# Patient Record
Sex: Female | Born: 1970 | Hispanic: Yes | Marital: Married | Smoking: Never smoker
Health system: Southern US, Community
[De-identification: ages and names within clinical notes are randomized; demographics above are authoritative.]

## PROBLEM LIST (undated history)

## (undated) DIAGNOSIS — N189 Chronic kidney disease, unspecified: Secondary | ICD-10-CM

## (undated) DIAGNOSIS — E119 Type 2 diabetes mellitus without complications: Secondary | ICD-10-CM

## (undated) DIAGNOSIS — J302 Other seasonal allergic rhinitis: Secondary | ICD-10-CM

## (undated) DIAGNOSIS — G4730 Sleep apnea, unspecified: Secondary | ICD-10-CM

## (undated) DIAGNOSIS — I10 Essential (primary) hypertension: Secondary | ICD-10-CM

## (undated) DIAGNOSIS — E8881 Metabolic syndrome: Secondary | ICD-10-CM

## (undated) DIAGNOSIS — D169 Benign neoplasm of bone and articular cartilage, unspecified: Secondary | ICD-10-CM

## (undated) DIAGNOSIS — C649 Malignant neoplasm of unspecified kidney, except renal pelvis: Secondary | ICD-10-CM

## (undated) DIAGNOSIS — N200 Calculus of kidney: Secondary | ICD-10-CM

## (undated) HISTORY — DX: Benign neoplasm of bone and articular cartilage, unspecified: D16.9

## (undated) HISTORY — PX: CESAREAN DELIVERY ONLY: 08600

## (undated) HISTORY — PX: CHOLECYSTECTOMY, LAPAROSCOPIC: SHX000214

## (undated) HISTORY — PX: HYSTERECTOMY: AMBSHX0005

## (undated) HISTORY — PX: REPAIR, HIATAL HERNIA: SHX001557

## (undated) HISTORY — PX: LAP UMBILICAL HERNIA REPAIR: S2076

## (undated) HISTORY — PX: NEPHRECTOMY: SHX001104

## (undated) HISTORY — PX: EXTRACORPOREAL SHOCK WAVE LITHOTRIPSY (ESWL): AMBPRO0219

## (undated) HISTORY — PX: OTHER SURGICAL HISTORY: SHX170

## (undated) HISTORY — PX: TUBAL LIGATION: SHX77

## (undated) HISTORY — DX: Chronic kidney disease, unspecified: N18.9

## (undated) HISTORY — PX: CHOLECYSTECTOMY: SHX55

## (undated) HISTORY — DX: Type 2 diabetes mellitus without complications: E11.9

## (undated) NOTE — Progress Notes (Signed)
Formatting of this note is different from the original.  Assessment/Plan     Jeanette Combs was seen today for well women visit.    Diagnoses and all orders for this visit:    Well woman exam with routine gynecological exam (Primary)    Chronic hypertension    Screening mammogram for breast cancer  -     Mammogram Breast Screening Tomosynthesis Bilateral; Future    Vaginal dryness  -     estradioL (VAGIFEM) 10 mcg tablet vaginal tablet; Insert 1 tablet (10 mcg total) into the vagina 2 (two) times a week.    Acute vaginitis  -     Vaginitis Panel    Body mass index is 28.8 kg/m.  Orders Placed This Encounter   Procedures    Mammogram Breast Screening Tomosynthesis Bilateral     Standing Status:   Future     Standing Expiration Date:   12/01/2023     Order Specific Question:   Is the patient pregnant?     Answer:   No     Order Specific Question:   Reason for Exam:     Answer:   screening     Outpatient Encounter Medications as of 10/01/2022   Medication Sig Dispense Refill    acetaminophen (TYLENOL 8 HOUR) 650 mg 8 hr tablet Take 650 mg by mouth every 8 (eight) hours if needed for mild pain score 1 - 3. Do not crush, chew, or split.      desvenlafaxine succinate (PRISTIQ) 50 mg 24-hr tablet Take 50 mg by mouth 1 (one) time each day in the evening.      ergocalciferol (VITAMIN D-2) 1,250 mcg (50,000 unit) capsule Take 50,000 Units by mouth Weekly.      LORazepam (ATIVAN) 1 mg tablet Take 0.5-1 mg by mouth 1 (one) time each day in the evening. 0.5 mg qam, 1 mg qhs      tirzepatide (Mounjaro) 5 mg/0.5 mL SQ Pen Injector Inject 10 mg under the skin every 7 (seven) days. Thursdays      valsartan (DIOVAN) 40 mg tablet Take 40 mg by mouth daily.      albuterol HFA (PROVENTIL HFA;VENTOLIN HFA) inhaler Inhale 2 puffs every 4 (four) hours if needed for wheezing or shortness of breath. (Patient not taking: Reported on 07/08/2022) 1 each 1    ascorbic acid/collagen hydr (COLLAGEN SKIN RENEWAL ORAL) Take 1 Dose by mouth 1 (one) time each day  in the morning. (Patient not taking: Reported on 05/19/2022)      [START ON 10/03/2022] estradioL (VAGIFEM) 10 mcg tablet vaginal tablet Insert 1 tablet (10 mcg total) into the vagina 2 (two) times a week. 24 tablet 3    HYDROmorphone (DILAUDID) 2 mg tablet Take 1 tablet (2 mg total) by mouth every 4 (four) hours if needed for severe pain score 7 - 10. (Patient not taking: Reported on 03/22/2022) 12 tablet 0    valsartan 40 mg tablet 160 mg, hydroCHLOROthiazide 25 mg tablet 12.5 mg Take by mouth 1 (one) time each day in the morning.      VALSARTAN-HYDROCHLOROTHIAZIDE ORAL Take by mouth daily.       No facility-administered encounter medications on file as of 10/01/2022.     RTC in 1 year for WWE or PRN.  Co-testing due 02/2026.  Provided [facility] Menopause h/o and reviewed UpToDate alternatives to HT. RX for Vagifem sent to pharmacy.  Mammogram ordered.  Will notify if vaginitis screen is positive.     Subjective  HPI  Jeanette Combs is a 20 y.o. female [phone] who presents for her well woman exam. Denies unusual vaginal itching, burning or discharge.  Denies dysuria or pain with intercourse.   With current partner x 2 years, feels Safe.  She is starting to experience hot flashes, she is not sleeping well, hip/joint/shoulder pain.     Tx for Trich in May.     02/2022 laparoscopic supracervical hysterectomy, bilateral salpingectomy, sacrocolpopexy with mesh.     History of abnormal Pap smear: yes - years ago, no treatment.  Normal since    Last co-testing 02/2021 negative  Last Mammogram 2022  Last Colonoscopy 03/2021    Family history of breast, endometrial, ovarian cancer yes - MGM breast CA  Family history of colon cancer or pancreatic cancer No  Family history of diabetes Yes parents, patient is prediabetic    No LMP recorded. (Menstrual status: Tubal Ligation).    Family History   Problem Relation Name Age of Onset    Anesthesia Reaction Mother      Diabetes Mother      Hypertension Mother      Heart disease  Father      Hypertension Father      No Known Problems Sister      Heart attack Brother      Cancer Maternal Grandmother          breast    Diabetes Maternal Grandmother      Hypertension Maternal Grandmother      Diabetes Maternal Grandfather      Hypertension Maternal Grandfather      Diabetes Paternal Grandmother      Hypertension Paternal Grandmother      Diabetes Paternal Grandfather      Hypertension Paternal Grandfather       Social History:   reports that she has never smoked. She has been exposed to tobacco smoke. She has never used smokeless tobacco. She reports current alcohol use. She reports that she does not use drugs.    Current Outpatient Medications:     acetaminophen (TYLENOL 8 HOUR) 650 mg 8 hr tablet, Take 650 mg by mouth every 8 (eight) hours if needed for mild pain score 1 - 3. Do not crush, chew, or split., Disp: , Rfl:     desvenlafaxine succinate (PRISTIQ) 50 mg 24-hr tablet, Take 50 mg by mouth 1 (one) time each day in the evening., Disp: , Rfl:     ergocalciferol (VITAMIN D-2) 1,250 mcg (50,000 unit) capsule, Take 50,000 Units by mouth Weekly., Disp: , Rfl:     LORazepam (ATIVAN) 1 mg tablet, Take 0.5-1 mg by mouth 1 (one) time each day in the evening. 0.5 mg qam, 1 mg qhs, Disp: , Rfl:     tirzepatide (Mounjaro) 5 mg/0.5 mL SQ Pen Injector, Inject 10 mg under the skin every 7 (seven) days. Thursdays, Disp: , Rfl:     valsartan (DIOVAN) 40 mg tablet, Take 40 mg by mouth daily., Disp: , Rfl:     albuterol HFA (PROVENTIL HFA;VENTOLIN HFA) inhaler, Inhale 2 puffs every 4 (four) hours if needed for wheezing or shortness of breath. (Patient not taking: Reported on 07/08/2022), Disp: 1 each, Rfl: 1    ascorbic acid/collagen hydr (COLLAGEN SKIN RENEWAL ORAL), Take 1 Dose by mouth 1 (one) time each day in the morning. (Patient not taking: Reported on 05/19/2022), Disp: , Rfl:     [START ON 10/03/2022] estradioL (VAGIFEM) 10 mcg tablet vaginal tablet, Insert 1 tablet (10  mcg total) into the vagina 2  (two) times a week., Disp: 24 tablet, Rfl: 3    HYDROmorphone (DILAUDID) 2 mg tablet, Take 1 tablet (2 mg total) by mouth every 4 (four) hours if needed for severe pain score 7 - 10. (Patient not taking: Reported on 03/22/2022), Disp: 12 tablet, Rfl: 0    valsartan 40 mg tablet 160 mg, hydroCHLOROthiazide 25 mg tablet 12.5 mg, Take by mouth 1 (one) time each day in the morning., Disp: , Rfl:     VALSARTAN-HYDROCHLOROTHIAZIDE ORAL, Take by mouth daily., Disp: , Rfl:     Levofloxacin and Sulfamethoxazole-trimethoprim    Review of Systems    Review of Systems   Constitutional:         Has been experiencing vasomotor symptoms   Gastrointestinal:  Negative for abdominal pain.   Genitourinary:  Negative for dyspareunia, dysuria, genital sores, urgency, vaginal bleeding, vaginal discharge and vaginal itching.   Breast:        Denies breast changes    Objective   Physical Exam:   Constitutional: No distress. She appears well-developed.   Neck: No thyromegaly present.   Cardiovascular:  Normal heart sounds.            Pulmonary/Chest: Effort normal and breath sounds normal.   Breast exam: No axillary or clavicular masses.  Breast symmetrical, no masses, no skin dimpling, no nipple discharge.    Abdominal:      Palpations: Abdomen is soft.      Tenderness: There is no abdominal tenderness. There is no guarding.   Musculoskeletal: Normal range of motion.   Neurological: She is alert and oriented to person, place, and time.   Skin: Skin is warm and dry.   Psychiatric: Judgment and thought content normal.   Genitourinary:    Genitourinary Comments: BUS clear, no unusual vaginal DC. Cervix pink, smooth, no lesions.  Vaginitis screen obtained. No CMT, uterus has been surgically removed. No adnexal tenderness or masses.          Electronically signed by Nicholes Stairs, CNM at 10/01/2022  4:51 PM PDT

## (undated) NOTE — ED Triage Notes (Signed)
Formatting of this note might be different from the original.  Headache since Friday. Reports checking blood pressure yesterday 170/111. Reports has been taking blood pressure medications as prescribed. Nausea but no vomiting. Reports "vision isn't as clear as it is normally."  Electronically signed by Tyrone Schimke at 01/29/2021 10:48 AM PDT

## (undated) NOTE — Progress Notes (Signed)
Formatting of this note might be different from the original.  Review of Systems   Review of Systems   Constitutional: Negative.  Negative for fever and unexpected weight change.   HENT: Negative.     Eyes: Negative.  Negative for pain and visual disturbance.   Respiratory:  Positive for apnea. Negative for cough and shortness of breath.    Cardiovascular: Negative.  Negative for chest pain and palpitations.   Gastrointestinal: Negative.  Negative for nausea and vomiting.   Endocrine: Negative.  Negative for cold intolerance and polyuria.   Genitourinary: Negative.    Musculoskeletal: Negative.  Negative for arthralgias and myalgias.   Skin: Negative.  Negative for rash.   Allergic/Immunologic: Negative.    Neurological:  Positive for headaches. Negative for seizures and numbness.   Hematological: Negative.  Does not bruise/bleed easily.   Psychiatric/Behavioral: Negative.  Negative for dysphoric mood and sleep disturbance.    Breast: Negative.         Electronically signed by Ewing Schlein, MA at 07/08/2022  1:29 PM PDT

## (undated) NOTE — Progress Notes (Signed)
Formatting of this note is different from the original.  Chief Complaint   Patient presents with    Face     HPI:  [name] is a 5 y.o. female with chief complaint of forehead lump.  Location is the right forehead.  Onset was 2014 around the time of her kidney cancer diagnosis.  Pattern has been slowly worsening.  There is tenderness to palpation of the lump.  She does tend to frequently wear hats in the summer and pressure is put directly on this area and it is very uncomfortable.  Struck in the forehead by a softball back in high school.  Otherwise, no history of trauma.    Past Medical History   Diagnosis Date    Abdominal hernia     Cancer (CMS/HCC)     [facility]    Hypertension     PONV (postoperative nausea and vomiting)     Pre-diabetes     Sleep apnea      Past Surgical History   Procedure Laterality Date    Cesarean section      Cholecystectomy      Dilation and curettage of uterus      Hernia repair      Hysterectomy  02/20/2022    Nephrectomy  2014    Wisdom tooth extraction       Current Outpatient Medications:     acetaminophen (TYLENOL 8 HOUR) 650 mg 8 hr tablet, Take 650 mg by mouth every 8 (eight) hours if needed for mild pain score 1 - 3. Do not crush, chew, or split., Disp: , Rfl:     desvenlafaxine succinate (PRISTIQ) 50 mg 24-hr tablet, Take 50 mg by mouth 1 (one) time each day in the evening., Disp: , Rfl:     ergocalciferol (VITAMIN D-2) 1,250 mcg (50,000 unit) capsule, Take 50,000 Units by mouth Weekly., Disp: , Rfl:     LORazepam (ATIVAN) 1 mg tablet, Take 0.5-1 mg by mouth 1 (one) time each day in the evening. 0.5 mg qam, 1 mg qhs, Disp: , Rfl:     tirzepatide (Mounjaro) 5 mg/0.5 mL SQ Pen Injector, Inject 10 mg under the skin every 7 (seven) days. Thursdays, Disp: , Rfl:     valsartan 40 mg tablet 160 mg, hydroCHLOROthiazide 25 mg tablet 12.5 mg, Take by mouth 1 (one) time each day in the morning., Disp: , Rfl:     albuterol HFA (PROVENTIL HFA;VENTOLIN HFA) inhaler, Inhale 2  puffs every 4 (four) hours if needed for wheezing or shortness of breath. (Patient not taking: Reported on 07/08/2022), Disp: 1 each, Rfl: 1    ascorbic acid/collagen hydr (COLLAGEN SKIN RENEWAL ORAL), Take 1 Dose by mouth 1 (one) time each day in the morning. (Patient not taking: Reported on 05/19/2022), Disp: , Rfl:     HYDROmorphone (DILAUDID) 2 mg tablet, Take 1 tablet (2 mg total) by mouth every 4 (four) hours if needed for severe pain score 7 - 10. (Patient not taking: Reported on 03/22/2022), Disp: 12 tablet, Rfl: 0  Allergies         Reactions    Levofloxacin          Social History     Tobacco Use    Smoking status: Never     Passive exposure: Past    Smokeless tobacco: Never   Substance Use Topics    Alcohol use: Yes     Comment: rare     Family History   Problem Relation  Age of Onset    Anesthesia Reaction Mother     Diabetes Mother     Hypertension Mother     Heart disease Father     Hypertension Father     No Known Problems Sister     Heart attack Brother     Cancer Maternal Grandmother         breast    Diabetes Maternal Grandmother     Hypertension Maternal Grandmother     Diabetes Maternal Grandfather     Hypertension Maternal Grandfather     Diabetes Paternal Grandmother     Hypertension Paternal Grandmother     Diabetes Paternal Grandfather     Hypertension Paternal Grandfather      Review of Systems   Constitutional: Negative.  Negative for fever and unexpected weight change.   HENT: Negative.     Eyes: Negative.  Negative for pain and visual disturbance.   Respiratory:  Positive for apnea. Negative for cough and shortness of breath.    Cardiovascular: Negative.  Negative for chest pain and palpitations.   Gastrointestinal: Negative.  Negative for nausea and vomiting.   Endocrine: Negative.  Negative for cold intolerance and polyuria.   Genitourinary: Negative.    Musculoskeletal: Negative.  Negative for arthralgias and myalgias.   Skin: Negative.  Negative for rash.   Allergic/Immunologic: Negative.     Neurological:  Positive for headaches. Negative for seizures and numbness.   Hematological: Negative.  Does not bruise/bleed easily.   Psychiatric/Behavioral: Negative.  Negative for dysphoric mood and sleep disturbance.    Breast: Negative.     Vitals:    07/08/22 1253   Temp: 36.6 C (97.8 F)   TempSrc: Temporal   Weight: 86.2 kg (190 lb 0.6 oz)   Height: 1.727 m (5\' 8" )     Physical examination    General Appearance:   No apparent distress.  Voice:     Normal.  Mental Status:   Alert, oriented.     Face:     Firm lesion of the right anterior forehead affixed to the underlying skull.  Overlying soft tissue rolls over it easily.  Tenderness to palpation.      Sensation intact to V1-3 bilaterally.       Strength and tone - normal.    Neck:     No palpable masses.  No LAD.      Eye:     Extra occular movement intact bilaterally.        Mild ptosis right upper eyelid (longstanding per patient).    Ears:   Pinna:     Bilateral - no growths or deformities.  External auditory canals:  Right -cerumen impaction.      Left - cerumen impaction.  Tympanic Membrane:  Right -unable to visualize      Left - unable to visualize  Hearing:    Gross speech reception intact.    Sinonasal:  External inspection:    No growths or deformities of external nose.   Nasal Mucosa:   No congestion.    Nasal Septum:   Deviates to the right.  Inferior turbinates:   No hypertrophy.      Mouth and Throat:  Lips:     Upper and lower lips without lesions.    Oral Cavity   Mucosa - well hydrated, free of lesions.      Tongue - normal tone and movement.      Gingiva - no bleeding.   Oropharynx:   No  swelling or lesions.        Soft Palate - normal motion.  Symmetric.      Neuropsychiatric:   Mood and affect - normal.      Assessment  and Plan   Problem List         Musculoskeletal    Osteoma of skull - Primary    Relevant Orders    Ambulatory referral to ENT   01/29/2022: CT head.  Right frontal skull osteoma.  Sinuses clear.    CT head was  personally reviewed.  Referral to Dr. Rober Minion, ENT at [facility].  Endoscopic approach to removal of frontal skull osteoma.    Electronically signed by Solon Palm, MD at 07/08/2022  1:29 PM PDT

## (undated) NOTE — Unmapped External Note (Signed)
Formatting of this note is different from the original.  BEST POSSIBLE MEDICATION HISTORY ([facility])  Jeanette Combs  30-Sep-1970 35 y.o. female  [phone]    Below is the Best Possible Medication History compiled for Jeanette Combs by a pharmacy technician, intern pharmacist, or pharmacist with information provided by the patient or the patient's family members, caregivers, providers, retail pharmacy, mail order pharmacy, and/or care facility.    Prior to Admission Medications     Prescriptions Last Dose Informant Patient Reported? Taking?    desvenlafaxine succinate (PRISTIQ) 50 mg 24-hr tablet 01/28/2021 Self Yes Yes    Take 50 mg by mouth daily.    LORazepam (ATIVAN) 1 mg tablet 01/28/2021 Self Yes Yes    Take 0.5-1 mg by mouth 2 times daily. 0.5 mg qam, 1 mg qhs    meloxicam (MOBIC) 15 mg tablet 01/29/2021 Self Yes Yes    Take 15 mg by mouth daily.    metFORMIN (GLUCOPHAGE) 500 mg tablet 01/29/2021 Self Yes Yes    Take 500 mg by mouth 2 (two) times a day with meals.    valsartan (DIOVAN) 160 mg tablet 01/28/2021 Self Yes Yes    Take 160 mg by mouth daily.    naproxen sodium (ALEVE) 220 mg tablet  Self Yes No    Take 220 mg by mouth 2 (two) times a day with meals.       Electronically signed by Lorelee New, Pharmacy Technician at 01/29/2021 11:27 AM PDT

## (undated) NOTE — ED Provider Notes (Signed)
Formatting of this note is different from the original.  We will reduce CHIEF COMPLAINT   Chief Complaint   Patient presents with   ? Hypertension       HPI   Jeanette Combs is a 28 y.o. female who presents with headache for the past 3 days.  States she woke up with this headache.  It is a throbbing moderate to severe constant pain.  Has had intermittent blurry vision but no double vision.  No nausea vomiting.  No fevers or chills.  No neck stiffness.  No focal deficits.  No chest pain or shortness of breath.  Checked her blood pressure at home and it was elevated up to 170 systolic.  History of prior kidney cancer and hypertension.    ALLERGIES   Allergies       Reactions    Levofloxacin          CURRENT MEDICATIONS   Previous Medications    DESVENLAFAXINE SUCCINATE (PRISTIQ) 50 MG 24-HR TABLET    Take 50 mg by mouth daily.    LORAZEPAM (ATIVAN) 1 MG TABLET    Take 0.5-1 mg by mouth 2 times daily. 0.5 mg qam, 1 mg qhs    MELOXICAM (MOBIC) 15 MG TABLET    Take 15 mg by mouth daily.    METFORMIN (GLUCOPHAGE) 500 MG TABLET    Take 500 mg by mouth 2 (two) times a day with meals.    NAPROXEN SODIUM (ALEVE) 220 MG TABLET    Take 220 mg by mouth 2 (two) times a day with meals.    VALSARTAN (DIOVAN) 160 MG TABLET    Take 160 mg by mouth daily.       PAST MEDICAL HISTORY   Past Medical History   Diagnosis Date   ? Abdominal hernia    ? Cancer (CMS/HCC)     [facility]   ? Pre-diabetes      SURGICAL HISTORY   Past Surgical History   Procedure Laterality Date   ? Cesarean section     ? Cholecystectomy     ? Dilation and curettage of uterus     ? Hernia repair     ? Kidney surgery  2014   ? Nephrectomy       SOCIAL HISTORY   Social History     Tobacco Use   ? Smoking status: Never Smoker   ? Smokeless tobacco: Never Used   Vaping Use   ? Vaping Use: never used   Substance Use Topics   ? Alcohol use: Yes     Comment: rare   ? Drug use: No     FAMILY HISTORY   Family History   Problem Relation Age of Onset   ? Diabetes Mother    ?  Hypertension Mother    ? Heart disease Father    ? Hypertension Father    ? Cancer Maternal Grandmother         breast   ? Diabetes Maternal Grandmother    ? Hypertension Maternal Grandmother    ? Diabetes Maternal Grandfather    ? Hypertension Maternal Grandfather    ? Diabetes Paternal Grandmother    ? Hypertension Paternal Grandmother    ? Diabetes Paternal Grandfather    ? Hypertension Paternal Grandfather        Past history has been reviewed by me     REVIEW OF SYSTEMS   A comprehensive review of symptoms is negative except as noted in the History of  Present Illness.     PHYSICAL EXAM   VITAL SIGNS:   Visit Vitals  BP (!) 168/98 (BP Location: Left;Upper arm, Patient Position: Sitting)   Pulse 65   Temp 36.6 C (97.9 F) (Tympanic)   Resp 16   Ht 1.727 m   Wt 92.8 kg   SpO2 98%   BMI 31.11 kg/m   OB Status Tubal Ligation   Smoking Status Never Smoker   BSA 2.06 m     GENERAL: Well-appearing, well-nourished and in no acute distress.  HEENT: Head normocephalic, atraumatic.  Eyes sclera anicteric, conjunctivae normal. Mucous membranes moist.  NECK: Supple, full range of motion, no lymphadenopathy.  PULM: Breath sounds equal bilaterally, no wheezes rales or rhonchi.  CV: Regular rate and rhythm. Well perfused  GI: Soft, nontender.  Nondistended.  No guarding or rebound.  MUSCOLOSKELETAL: Normal range of motion, no clubbing or edema.  NEUROLOGICAL: Awake, alert, and oriented x3. Cranial nerves II through XII grossly intact.  5/5 strength in the bilateral upper and lower extremities with no pronator drift.    SKIN: Warm, dry, no petechiae, no rashes or lesions.  PSYCH: Normal affect    Differential diagnosis and anticipated course:  This is a 74 year old female who presents with headache and hypertension for the last 3 days.  Headache does not seem consistent with subarachnoid hemorrhage or meningitis.  However given her history of prior cancer will obtain a CT scan to rule out mass.  Will obtain labs.  We will  treat with Toradol, Compazine and Benadryl.    Any laboratory, radiology and EKG results reviewed/interpreted by Lauris Poag, MD  LABS   Labs Reviewed   COMPREHENSIVE METABOLIC PANEL - Abnormal       Result Value    Glucose 103      Blood Urea Nitrogen 15      Creat, Serum 0.8      Sodium 137      Potassium 3.8      Chloride 101      CO2 31 (*)     Anion Gap 5 (*)     Calcium 8.9      Total Bilirubin <0.6      Alkaline Phosphatase 68      AST 20      ALT (SGPT) 18      Total Protein 7.0      Albumin 4.0      Globulin, Total 3.0      A/G Ratio 1.33      BUN/Creatinine Ratio 18.75      Corrected Calcium 8.9      eGFR >60.00     CBC WITH AUTO DIFF - Normal    WBC 9.4      RBC 4.8      Hgb 14.3      Hematocrit 41.2      MCV 86.0      MCH 29.9      MCHC 34.7      Red Cell Distribution Width 11.9      Platelets 203      Mean Platelet Volume 9.5      Neutrophils Relative 65.9      Lymphocytes Relative 25.0      Monocytes Relative 6.7      Eosinophils Relative 1.6      Basophils Relative 0.3      NRBC Relative 0.0      Immature Gran Relative 0.5      Neutrophils Absolute 6.2  Lymphocytes Absolute 2.4      Monocytes Absolute 0.6      Eosinophils Absolute 0.2      Basophils Absolute 0.0      NRBC Absolute 0.0      Immature Gran Absolute 0.1     PRO BNP - Normal    BNP N Terminal 81.7         RADIOLOGY   CT Head WO Contrast   Final Result     No acute pathology identified.           Electronically Signed By: Amparo Bristol PETTY MD    On:01/29/2021 1:35 PM     Doc #: [id number]     X-ray chest 1 view portable   Final Result     Cardiomegaly with prominent central vascular congestion and/or acute CHF.     Electronically Signed By: Conni Slipper, MD    On:01/29/2021 11:40 AM     Doc #: [id number]     Single AP VIEW Portable Chest X-ray was interpreted independently and contemporaneously by A. Eula Listen, MD:   No cardiomegaly  Normal aortic shadow  No lung infiltrates  No pneumothorax  No abdominal free air  No other soft tissue or  bony abnormalities  Overall impression: No acute findings    EKG   12-lead EKG Interpretation by A. Eula Listen, MD:   Normal Sinus Rhythm at 82 beats per minute  Normal axis  Normal intervals  No ectopic beats  Inferior Q waves  No other acute ST or T wave changes  Overall impression is abnormal EKG    PROCEDURE   Procedures    The medications that were given during this visit includes:   Medications   ketorolac (TORADOL) injection 30 mg (30 mg intramuscular Given 01/29/21 1215)   prochlorperazine (COMPAZINE) tablet 10 mg (10 mg oral Given 01/29/21 1216)   diphenhydrAMINE (BENADRYL) capsule 25 mg (25 mg oral Given 01/29/21 1215)       ED COURSE  Nursing notes have been reviewed by me  Prior medical records have been reviewed by me and reveals prior OB/GYN and general surgery clinic visits    MEDICAL DECISION MAKING   This is a 35 year old female who presents with headache and hypertension.  EKG, chest x-ray and laboratory work-up with no evidence of acute cardiopulmonary process.  No clinical evidence of CHF.  CT is negative for mass or bleed.  No need for admission or further work-up at this time.  Recommend rest and hydration.  Patient understands and agrees with plan. Patient is well-appearing, nontoxic and stable for discharge with strict return precautions.    PLAN   Discussed dx, medications and treatment plan with patient  Rx for D/C - see below.   The discharge medications included (please note that some discharge medications may be mentioned above, and may not be listed below):     New Prescriptions    No medications on file       FINAL IMPRESSION   1. Acute nonintractable headache, unspecified headache type    2. Primary hypertension        Parts of this note were created using Dragon Voice Recognition software program. While efforts were made to correct any mistakes made by this voice recognition software program, nonsensical phrases may remain in this note.    MDM    Lauris Poag, MD  01/29/21  1343    Electronically signed by Lauris Poag, MD at 01/29/2021  1:43 PM PDT

---

## 2014-07-04 ENCOUNTER — Other Ambulatory Visit (HOSPITAL_COMMUNITY): Payer: Self-pay | Admitting: Urology

## 2014-07-04 DIAGNOSIS — Z1231 Encounter for screening mammogram for malignant neoplasm of breast: Secondary | ICD-10-CM

## 2014-07-07 ENCOUNTER — Ambulatory Visit (HOSPITAL_COMMUNITY)
Admission: RE | Admit: 2014-07-07 | Discharge: 2014-07-07 | Disposition: A | Discharge status: Home / Self Care | Payer: Self-pay | Source: Ambulatory Visit | Attending: Urology | Admitting: Urology

## 2014-07-07 DIAGNOSIS — Z1231 Encounter for screening mammogram for malignant neoplasm of breast: Secondary | ICD-10-CM

## 2015-07-18 ENCOUNTER — Other Ambulatory Visit: Payer: Self-pay | Admitting: Nurse Practitioner

## 2015-07-18 DIAGNOSIS — Z1231 Encounter for screening mammogram for malignant neoplasm of breast: Secondary | ICD-10-CM

## 2015-07-26 ENCOUNTER — Ambulatory Visit
Admission: RE | Admit: 2015-07-26 | Discharge: 2015-07-26 | Disposition: A | Discharge status: Home / Self Care | Payer: No Typology Code available for payment source | Source: Ambulatory Visit | Attending: Nurse Practitioner | Admitting: Nurse Practitioner

## 2015-07-26 DIAGNOSIS — Z1231 Encounter for screening mammogram for malignant neoplasm of breast: Secondary | ICD-10-CM

## 2015-07-27 ENCOUNTER — Other Ambulatory Visit: Payer: Self-pay | Admitting: Nurse Practitioner

## 2015-07-27 DIAGNOSIS — R928 Other abnormal and inconclusive findings on diagnostic imaging of breast: Secondary | ICD-10-CM

## 2015-08-08 ENCOUNTER — Other Ambulatory Visit (HOSPITAL_COMMUNITY): Payer: Self-pay | Admitting: *Deleted

## 2015-08-08 DIAGNOSIS — R928 Other abnormal and inconclusive findings on diagnostic imaging of breast: Secondary | ICD-10-CM

## 2015-08-17 ENCOUNTER — Encounter (HOSPITAL_COMMUNITY): Payer: Self-pay

## 2015-08-17 ENCOUNTER — Ambulatory Visit
Admission: RE | Admit: 2015-08-17 | Discharge: 2015-08-17 | Disposition: A | Discharge status: Home / Self Care | Payer: No Typology Code available for payment source | Source: Ambulatory Visit | Attending: Obstetrics and Gynecology | Admitting: Obstetrics and Gynecology

## 2015-08-17 ENCOUNTER — Other Ambulatory Visit (HOSPITAL_COMMUNITY): Payer: Self-pay | Admitting: Obstetrics and Gynecology

## 2015-08-17 ENCOUNTER — Ambulatory Visit (HOSPITAL_COMMUNITY)
Admission: RE | Admit: 2015-08-17 | Discharge: 2015-08-17 | Disposition: A | Discharge status: Home / Self Care | Payer: Self-pay | Source: Ambulatory Visit | Attending: Obstetrics and Gynecology | Admitting: Obstetrics and Gynecology

## 2015-08-17 VITALS — BP 100/62 | Temp 98.4°F | Ht 60.0 in | Wt 182.4 lb

## 2015-08-17 DIAGNOSIS — R928 Other abnormal and inconclusive findings on diagnostic imaging of breast: Secondary | ICD-10-CM

## 2015-08-17 DIAGNOSIS — Z1239 Encounter for other screening for malignant neoplasm of breast: Secondary | ICD-10-CM

## 2015-08-17 HISTORY — DX: Other seasonal allergic rhinitis: J30.2

## 2015-08-17 NOTE — Patient Instructions (Signed)
Educational materials on self breast awareness given. Explained to Tracey ChapelMaria Estela Ortega that she did not need a Pap smear today due to last Pap smear was in 2016 per patient. Let her know [facility] will cover Pap smears every 3 years unless has a history of abnormal Pap smears. [facility] breast diagnostic mamm[facility] per recommendation. Appointment scheduled for Thursday, Aug 17, 2015 at 1520.  Tracey Ortega verbalized understanding.  Therisa Mennella, Kathaleen Maserhristine Poll, RN 4:10 PM

## 2015-08-17 NOTE — Progress Notes (Signed)
[facility] due to needing additional imaging of the left breast. Screening mammogram completed 07/26/2015.  Pap Smear: Pap smear not completed today. [facility] and normal per patient. Per patient has no history of an abnormal Pap smear. No Pap smear results in EPIC.  Physical exam: Breasts Breasts symmetrical. No skin abnormalities bilateral breasts. No nipple retraction bilateral breasts. No nipple discharge bilateral breasts. No lymphadenopathy. No lumps palpated bilateral breasts. No complaints of pain or tenderness on exam.   [facility] a left breast diagnostic mammogram per recommendation. Appointment scheduled for Thursday, Aug 17, 2015 at 1520.  Pelvic/Bimanual No Pap smear completed today since last Pap smear was in 2016 per patient. Pap smear not indicated per [facility]CP guidelines.   Smoking History: Patient has never smoked.  Patient Navigation: Patient education provided. [facility] services provided [facility] through [facility].

## 2015-08-18 ENCOUNTER — Encounter (HOSPITAL_COMMUNITY): Payer: Self-pay | Admitting: *Deleted

## 2015-11-20 ENCOUNTER — Encounter (HOSPITAL_COMMUNITY): Payer: Self-pay | Admitting: *Deleted

## 2016-06-19 VITALS — BP 128/78 | HR 81 | Temp 97.5°F | Ht 62.0 in | Wt 177.2 lb

## 2016-06-19 DIAGNOSIS — R109 Unspecified abdominal pain: Secondary | ICD-10-CM

## 2016-06-19 DIAGNOSIS — M5441 Lumbago with sciatica, right side: Secondary | ICD-10-CM

## 2016-06-19 DIAGNOSIS — M5136 Other intervertebral disc degeneration, lumbar region: Secondary | ICD-10-CM | POA: Insufficient documentation

## 2016-06-19 DIAGNOSIS — E119 Type 2 diabetes mellitus without complications: Secondary | ICD-10-CM | POA: Insufficient documentation

## 2016-06-19 LAB — POCT URINALYSIS DIP (MANUAL ENTRY)
BILIRUBIN UA: NEGATIVE
BILIRUBIN UA: NEGATIVE
GLUCOSE UA: NEGATIVE
PH UA: 5.5
RBC UA: NEGATIVE
SPEC GRAV UA: 1.015

## 2016-08-06 ENCOUNTER — Other Ambulatory Visit: Payer: Self-pay | Admitting: Nurse Practitioner

## 2016-08-06 DIAGNOSIS — Z1231 Encounter for screening mammogram for malignant neoplasm of breast: Secondary | ICD-10-CM

## 2016-08-19 ENCOUNTER — Ambulatory Visit
Admission: RE | Admit: 2016-08-19 | Discharge: 2016-08-19 | Disposition: A | Discharge status: Home / Self Care | Payer: No Typology Code available for payment source | Source: Ambulatory Visit | Attending: Nurse Practitioner | Admitting: Nurse Practitioner

## 2016-08-19 DIAGNOSIS — Z1231 Encounter for screening mammogram for malignant neoplasm of breast: Secondary | ICD-10-CM

## 2017-07-04 ENCOUNTER — Other Ambulatory Visit: Payer: Self-pay | Admitting: Obstetrics & Gynecology

## 2017-07-04 DIAGNOSIS — Z1231 Encounter for screening mammogram for malignant neoplasm of breast: Secondary | ICD-10-CM

## 2017-08-26 ENCOUNTER — Ambulatory Visit
Admission: RE | Admit: 2017-08-26 | Discharge: 2017-08-26 | Disposition: A | Discharge status: Home / Self Care | Payer: No Typology Code available for payment source | Source: Ambulatory Visit | Attending: Obstetrics & Gynecology | Admitting: Obstetrics & Gynecology

## 2017-08-26 DIAGNOSIS — Z1231 Encounter for screening mammogram for malignant neoplasm of breast: Secondary | ICD-10-CM

## 2018-07-31 ENCOUNTER — Other Ambulatory Visit (HOSPITAL_COMMUNITY): Payer: Self-pay | Admitting: *Deleted

## 2018-07-31 DIAGNOSIS — Z1231 Encounter for screening mammogram for malignant neoplasm of breast: Secondary | ICD-10-CM

## 2018-11-24 ENCOUNTER — Ambulatory Visit
Admission: RE | Admit: 2018-11-24 | Discharge: 2018-11-24 | Disposition: A | Discharge status: Home / Self Care | Payer: No Typology Code available for payment source | Source: Ambulatory Visit | Attending: Obstetrics and Gynecology | Admitting: Obstetrics and Gynecology

## 2018-11-24 ENCOUNTER — Encounter (HOSPITAL_COMMUNITY): Payer: Self-pay

## 2018-11-24 ENCOUNTER — Other Ambulatory Visit: Payer: Self-pay

## 2018-11-24 ENCOUNTER — Ambulatory Visit (HOSPITAL_COMMUNITY)
Admission: RE | Admit: 2018-11-24 | Discharge: 2018-11-24 | Disposition: A | Discharge status: Home / Self Care | Payer: No Typology Code available for payment source | Source: Ambulatory Visit | Attending: Obstetrics and Gynecology | Admitting: Obstetrics and Gynecology

## 2018-11-24 DIAGNOSIS — Z1239 Encounter for other screening for malignant neoplasm of breast: Secondary | ICD-10-CM | POA: Insufficient documentation

## 2018-11-24 DIAGNOSIS — Z1231 Encounter for screening mammogram for malignant neoplasm of breast: Secondary | ICD-10-CM

## 2018-11-24 NOTE — Patient Instructions (Signed)
Explained breast self awareness with Clyda Greener. Patient did not need a Pap smear today due to last Pap smear was in March/April 2019 per patient. Let her know [facility] will cover Pap smears every 3 years unless has a history of abnormal Pap smears. Referred patient to the [place] for a screening mammogram. Appointment scheduled for Tuesday, November 24, 2018 at 1130. Patient aware of appointment and will be there. Let patient know the Breast Center will follow up with her within the next couple weeks with results of mammogram by letter or phone. Clyda Greener verbalized understanding.  Sheina Mcleish, Arvil Chaco, RN 10:46 AM

## 2018-11-24 NOTE — Progress Notes (Signed)
No complaints today.   Pap Smear: Pap smear not completed today. Last Pap smear was in March/April 2019 at the [facility] and normal per patient. Per patient has no history of an abnormal Pap smear. Last Pap smear result is not in Epic. Previous Pap smear result from 06/22/2014 is in [place].  Physical exam: Breasts Breasts symmetrical. No skin abnormalities bilateral breasts. No nipple retraction bilateral breasts. No nipple discharge bilateral breasts. No lymphadenopathy. No lumps palpated bilateral breasts. No complaints of pain or tenderness on exam. Referred patient to the [place] for a screening mammogram. Appointment scheduled for Tuesday, November 24, 2018 at 1130.        Pelvic/Bimanual No Pap smear completed today since last Pap smear was in March/April 2019. Pap smear not indicated per B[facility]CP guidelines.   Smoking History: Patient has never smoked.  Patient Navigation: Patient education provided. Access to services provided for patient through [facility] program.   Breast and Cervical Cancer Risk Assessment: Patient has no family history of breast cancer, known genetic mutations, or radiation treatment to the chest before age 65. Patient has no history of cervical dysplasia, immunocompromised, or DES exposure in-utero.  Risk Assessment    Risk Scores      11/24/2018   Last edited by: Armond Hang, LPN   5-year risk: 0.8 %   Lifetime risk: 7.9 %

## 2018-11-26 ENCOUNTER — Encounter (HOSPITAL_COMMUNITY): Payer: Self-pay | Admitting: *Deleted

## 2018-11-30 ENCOUNTER — Other Ambulatory Visit: Payer: Self-pay

## 2018-11-30 ENCOUNTER — Inpatient Hospital Stay: Payer: Self-pay | Attending: Obstetrics and Gynecology | Admitting: *Deleted

## 2018-11-30 VITALS — BP 122/84 | Temp 97.3°F | Ht 59.0 in | Wt 166.0 lb

## 2018-11-30 DIAGNOSIS — Z0000 Encounter for general adult medical examination without abnormal findings: Secondary | ICD-10-CM

## 2018-12-01 LAB — HGB A1C W/O EAG: Hgb A1c MFr Bld: 6.3 % — ABNORMAL HIGH (ref 4.8–5.6)

## 2018-12-01 LAB — GLUCOSE, RANDOM: Glucose: 119 mg/dL — ABNORMAL HIGH (ref 65–99)

## 2018-12-01 LAB — LIPID PANEL W/O CHOL/HDL RATIO
Cholesterol, Total: 182 mg/dL (ref 100–199)
HDL: 56 mg/dL (ref >39)
LDL Calculated: 100 mg/dL — ABNORMAL HIGH (ref 0–99)
Triglycerides: 131 mg/dL (ref 0–149)
VLDL Cholesterol Cal: 26 mg/dL (ref 5–40)

## 2018-12-01 NOTE — Progress Notes (Signed)
[facility] initial screening   interpreter- Rudene Anda, [employer]   Clinical Measurement:  Height: 59 in Weight: 166 lb  Blood Pressure: 120/84  Blood Pressure #2: 122/84 Fasting Labs Drawn Today, will review with patient when they result.   Medical History:  Patient states that she does not have a history of high cholesterol, high blood pressure or diabetes.  Medications:  Patient states that she does not take  medication to lower cholesterol, blood pressure or blood sugar.  Patient does not take an aspirin a day to help prevent a heart attack or stroke.    Blood pressure, self measurement: Patient states that she measures blood pressure from home monthly. Patient does not share blood pressure readings with a healthcare provider.   Nutrition: Patient states that on average she eats 1 cups of fruit and 1 cups of vegetables per day. Patient states that she does not eat fish at least 2 times per week. Patient eats less than half servings of whole grains. Patient drinks less than 36 ounces of beverages with added sugar weekly. Patient is not currently watching sodium or salt intake. In the past 7 days patient has had 1 drink containing alcohol. On average patient drinks 1 drink containing alcohol.      Physical activity:  Patient states that she gets 0 minutes of moderate and 0 minutes of vigorous physical activity each week.  Smoking status:  Patient states that she has never smoked tobacco.   Quality of life:  Over the past 2 weeks patient states that she has not had any days where she has little interest or pleasure in doing things and 0 days where she has felt down, depressed or hopeless.    Risk reduction and counseling:    Health Coaching: Educated patient that daily recommendations for fruit is 2 cups per day and 3 cups of vegetables per day. I also told the patient about heart healthy fish like salmon, tuna and mackerel that she can try to eat 2 times a week. We also discussed what whole  grains are and how she can make substitutions to add them into her diet.   Navigation:  I will notify patient of lab results.  Patient is aware of 2 more health coaching sessions and a follow up.  Time: 20 minutes

## 2018-12-02 ENCOUNTER — Telehealth: Payer: Self-pay

## 2018-12-02 NOTE — Telephone Encounter (Signed)
Health coaching 2     Labs- 182 cholesterol, 100 LDL cholesterol, 131 triglycerides, 56 HDL cholesterol, 6.3 hemoglobin A1C, 119 mean plasma glucose  Patient understands and is aware of her lab results.   Goals-  Spoke with patient about lab results. Answered any questions patient had regarding labs. Informed patient that since her hemoglobin A1C and glucose were elevated I would be referring her for follow-up at [facility].  Goals-Eat more fruits and vegetables. With a goal of 2 fruits and 3 vegetables per day. Also to watch sugar/sweets and carbs that she consumes. Explained that too much sugar and carbs can raise blood sugar. Encouraged patient to also start walking for 20 minutes a day.    Navigation:  Patient is aware of 1 more health coaching sessions and a follow up. Follow-up scheduled with [facility] on September 1st @ 3:30 pm.  Time- 15 minutes

## 2018-12-08 ENCOUNTER — Ambulatory Visit (INDEPENDENT_AMBULATORY_CARE_PROVIDER_SITE_OTHER): Payer: Self-pay | Admitting: Internal Medicine

## 2018-12-08 ENCOUNTER — Other Ambulatory Visit: Payer: Self-pay

## 2018-12-08 ENCOUNTER — Encounter: Payer: Self-pay | Admitting: Internal Medicine

## 2018-12-08 DIAGNOSIS — M79671 Pain in right foot: Secondary | ICD-10-CM

## 2018-12-08 DIAGNOSIS — R030 Elevated blood-pressure reading, without diagnosis of hypertension: Secondary | ICD-10-CM

## 2018-12-08 DIAGNOSIS — M79672 Pain in left foot: Secondary | ICD-10-CM

## 2018-12-08 DIAGNOSIS — E669 Obesity, unspecified: Secondary | ICD-10-CM | POA: Insufficient documentation

## 2018-12-08 DIAGNOSIS — R7303 Prediabetes: Secondary | ICD-10-CM

## 2018-12-08 DIAGNOSIS — Z6833 Body mass index (BMI) 33.0-33.9, adult: Secondary | ICD-10-CM

## 2018-12-08 DIAGNOSIS — R350 Frequency of micturition: Secondary | ICD-10-CM

## 2018-12-08 NOTE — Assessment & Plan Note (Signed)
Patient referred with an elevated hemoglobin A1c of 6.3 and a fasting glucose of 119 detected.  Patient with family history of diabetes mellitus.  Patient's labs are consistent with prediabetes.  There is no strong indication at this time for anti-diabetic medical management.  Patient was counseled on weight loss, diet modification, and exercise.  Patient is motivated to make necessary lifestyle modifications.  We will recheck hemoglobin A1c at follow-up appointment in 1 year.

## 2018-12-08 NOTE — Progress Notes (Signed)
   CC: Elevated hemoglobin A1c  HPI:  [name] is a 48 y.o. with no significant past medical history who presents from [facility] program referral for elevated hemoglobin A1c to 6.3.  Please see problem based charting below.  Past Medical History:  Diagnosis Date  . Seasonal allergies    PSH: C-section  Social History: Denies alcohol, recreational drug, or smoking  Family History: Mother and brother have diabetes and hypertension  Allergies: Denies allergies to medications  Review of Systems:   Review of Systems  Constitutional: Negative for chills and fever.  HENT: Negative for congestion.   Respiratory: Negative for cough and shortness of breath.   Cardiovascular: Negative for chest pain.  Gastrointestinal: Negative for abdominal pain, constipation, diarrhea, nausea and vomiting.  Genitourinary: Positive for frequency (mild increase in urination). Negative for dysuria and urgency.  All other systems reviewed and are negative.    Physical Exam:  Vitals:   12/08/18 1550  BP: 131/69  Pulse: 82  Temp: 98.2 F (36.8 C)  TempSrc: Oral  SpO2: 100%  Weight: 167 lb 3.2 oz (75.8 kg)  Height: 4\' 11"  (1.499 m)   Physical Exam  Constitutional: She is well-developed, well-nourished, and in no distress.  HENT:  Head: Normocephalic and atraumatic.  Eyes: EOM are normal. Right eye exhibits no discharge. Left eye exhibits no discharge.  Neck: Normal range of motion.  Cardiovascular: Normal rate and regular rhythm. Exam reveals no gallop and no friction rub.  No murmur heard. Pulmonary/Chest: Effort normal and breath sounds normal. No respiratory distress. She has no wheezes. She has no rales.  Abdominal: Soft. She exhibits no distension. There is no abdominal tenderness. There is no rebound and no guarding.  Musculoskeletal: Normal range of motion.        General: No tenderness, deformity or edema.     Comments: Feet examined, no tenderness to palpation  appreciated  Neurological: She is alert. Coordination normal.  Skin: Skin is warm and dry. No rash noted. She is not diaphoretic. No erythema.  Psychiatric: Memory and judgment normal.     Assessment & Plan:   See Encounters Tab for problem based charting.  Patient seen and discussed with Dr. Heber Rancho Ortega

## 2018-12-08 NOTE — Assessment & Plan Note (Signed)
Patient reports bilateral heel pain that has been present over the past 2 weeks.  Patient reports the pain often occurs when she first places her feet on the floor.  Pain has improved with increase in exercise regimen over the past weekend and patient has recently acquired good fitting shoes.  Advised patient on short-term use of ibuprofen for pain management.  Exam without tenderness to palpation or visible abnormality. Pain may be secondary to bone spurs

## 2018-12-08 NOTE — Patient Instructions (Signed)
Today you were seen for an elevated hemoglobin A1c.  This tells [place] that the sugar in your blood has been elevated over the past few months.  Your level is what is called a pre-diabetic level but is not a level at which we need to start medication at this time.  By trying to eat healthier and exercising you can help to lower your hemoglobin A1c level and avoid developing diabetes.  Even a weight loss of 5 to 10 pounds can significantly improve your health.  We want to see you back in 1 year to check your hemoglobin A1c and monitor your progress.  Your blood pressure was also mildly elevated to 131/69.  It is not at a level at which we need to start medications.  Your blood pressure can also be greatly benefited by healthy eating and weight loss.  If you are at a pharmacy and can measure your blood pressure record these readings and bring them to your next appointment.  Having these readings can help [place] understand what your blood pressure is outside the clinic and help [place] in deciding how to best manage your blood pressure.

## 2018-12-08 NOTE — Assessment & Plan Note (Addendum)
Patient counseled on lifestyle modifications and provided handouts on topics including changes to diet, exercise and the need for weight loss.  If patient is successfully able to implement lifestyle interventions this will greatly benefit her pre-hypertension and prediabetes.

## 2018-12-08 NOTE — Assessment & Plan Note (Addendum)
Patient with mildly elevated blood pressure to 131/69 in the prehypertensive range.  Patient does not have a prior diagnosis of hypertension. No chest pain, SOB. Patient ASCVD risk of 0.8%.  Lipid profile was within normal limits other than a borderline elevated LDL of 100.  No significant comorbidities that would prompt early initiation of antihypertensive therapy. Patient encouraged to measure her blood pressure as an outpatient and record these values.  Will reassess at follow-up appointment

## 2018-12-10 NOTE — Progress Notes (Signed)
Internal Medicine Clinic Attending  I saw and evaluated the patient.  I personally confirmed the key portions of the history and exam documented by Dr. MacLean and I reviewed pertinent patient test results.  The assessment, diagnosis, and plan were formulated together and I agree with the documentation in the resident's note.  

## 2019-02-03 ENCOUNTER — Telehealth: Payer: Self-pay

## 2019-02-03 NOTE — Telephone Encounter (Signed)
Health Coaching 3  interpreter- Rudene Anda, [employer]   Goals- Patient states that she has been walking 5-6 days a week for 30-40 minutes. Patient states that she has also been trying to watch her sweets and carbs. Patient has increased her fruit intake to 2 cups a day. Encouraged patient to continue    New goal- Increase daily vegetable intake from 2 cups a day to 3 cups a day.  Barrier to reaching goal- none   Strategies to overcome- none   Navigation:  Patient is aware of  a follow up session. Patient is scheduled for follow-up visit on December 9th @ 10:00 am   Time- 10 minutes

## 2019-03-17 ENCOUNTER — Ambulatory Visit: Payer: No Typology Code available for payment source

## 2019-04-19 ENCOUNTER — Ambulatory Visit: Payer: No Typology Code available for payment source

## 2019-05-24 ENCOUNTER — Ambulatory Visit: Payer: No Typology Code available for payment source

## 2019-06-02 ENCOUNTER — Ambulatory Visit: Payer: No Typology Code available for payment source

## 2019-06-07 ENCOUNTER — Other Ambulatory Visit: Payer: Self-pay

## 2019-06-07 ENCOUNTER — Inpatient Hospital Stay: Payer: Self-pay | Attending: Obstetrics and Gynecology | Admitting: *Deleted

## 2019-06-07 VITALS — BP 135/88 | Temp 98.0°F | Ht 59.0 in | Wt 170.0 lb

## 2019-06-07 DIAGNOSIS — Z0000 Encounter for general adult medical examination without abnormal findings: Secondary | ICD-10-CM

## 2019-06-07 NOTE — Progress Notes (Signed)
Tracey Ortega follow up   Clinical Measurement:  Height: in Weight: lb  Blood Pressure: 135/84  Blood Pressure #2: 135/88    Medical History:  Patient states that she does not have a history of high cholesterol, blood pressure or diabetes.   Medications:  Patient states that she does not take medication to lower cholesterol, blood pressure or blood sugar. Patient does not take an aspirin a day to help prevent a heart attack or stroke.   Blood pressure, self measurement:  Patient states that she does measure blood pressure from home monthly but does not share her readings with a healthcare provider.    Nutrition:  Patient states that on average she eats 2 cups of fruit and 2 cups of vegetables per day. Patient states that she does eat fish at least 2 times per week. In a typical day patient eats less than half servings of whole grains. Patient drinks less than 36 ounces of beverages with added sugar weekly. Patient is currently watching sodium or salt intake. In the past 7 days patient has not had any drinks containing alcohol. On average patient does not drink any drinks containing alcohol.       Physical activity:  Patient states that she gets 210 minutes of moderate and 0 minutes of vigorous physical activity each week.  Smoking status:  Patient states that she has never smoked tobacco.   Quality of life:  Over the past 2 weeks patient states that she has had 0 days where she has little interest or pleasure in doing things and 0 days where she has felt down, depressed or hopeless.   Health Coaching: Encouraged patient to continue with daily exercise. Patient states that she is currently walking for 30-45 minutes daily. Encouraged patient to continue watching the amounts of sweets and sugars and carbs that she consumes. Also encouraged patient to continue watching the amount of fried and fatty foods that she consumes. Encouraged patient to continue eating heart healthy fish at least 2 times a week as  well as whole grains.   Navigation: This was the  follow up session for this patient, I will check up on her progress in the coming months.  Time: 20 minutes

## 2019-06-14 NOTE — Progress Notes (Signed)
Patient seen and assessed by nursing staff during this encounter. I have reviewed the chart and agree with the documentation and plan.  Catalina Antigua, MD 06/14/2019 4:07 PM

## 2019-07-27 ENCOUNTER — Encounter: Payer: Self-pay | Admitting: *Deleted

## 2019-08-25 ENCOUNTER — Other Ambulatory Visit: Payer: Self-pay

## 2019-08-25 DIAGNOSIS — N6320 Unspecified lump in the left breast, unspecified quadrant: Secondary | ICD-10-CM

## 2019-08-31 ENCOUNTER — Ambulatory Visit: Payer: Self-pay | Admitting: *Deleted

## 2019-08-31 ENCOUNTER — Other Ambulatory Visit: Payer: Self-pay | Admitting: Obstetrics and Gynecology

## 2019-08-31 ENCOUNTER — Ambulatory Visit
Admission: RE | Admit: 2019-08-31 | Discharge: 2019-08-31 | Disposition: A | Discharge status: Home / Self Care | Payer: No Typology Code available for payment source | Source: Ambulatory Visit | Attending: Obstetrics and Gynecology | Admitting: Obstetrics and Gynecology

## 2019-08-31 ENCOUNTER — Other Ambulatory Visit: Payer: Self-pay

## 2019-08-31 VITALS — BP 128/88 | Temp 98.2°F | Wt 169.7 lb

## 2019-08-31 DIAGNOSIS — N6320 Unspecified lump in the left breast, unspecified quadrant: Secondary | ICD-10-CM

## 2019-08-31 DIAGNOSIS — Z124 Encounter for screening for malignant neoplasm of cervix: Secondary | ICD-10-CM

## 2019-08-31 DIAGNOSIS — Z01419 Encounter for gynecological examination (general) (routine) without abnormal findings: Secondary | ICD-10-CM

## 2019-08-31 NOTE — Progress Notes (Signed)
Tracey Ortega is a 49 y.o. [id number] female who presents to [facility] clinic today with complaints of a left axillary lump after her second COVID Vaccine on 08/17/2019. Patient stated that the left axillary lump resolved last Friday.    Pap Smear: Pap smear completed today. Last Pap smear was 06/22/2014 at the [facility] and normal. Per patient has no history of an abnormal Pap smear. Last Pap smear result is in Epic.   Physical exam: Breasts Right breast slightly larger than left. No skin abnormalities bilateral breasts. No nipple retraction bilateral breasts. No nipple discharge bilateral breasts. No lymphadenopathy. No lumps palpated bilateral breasts. Unable to palpate a lump in patients area of concern within the left axilla. Complaints of left axillary pain on exam.       Pelvic/Bimanual Ext Genitalia No lesions, no swelling and no discharge observed on external genitalia.        Vagina Vagina pink and normal texture. No lesions or discharge observed in vagina.        Cervix Cervix is present. Cervix pink and of normal texture. Cervix friable. No discharge observed.  Polyp observed at cervical os. Referred patient to the Center of [facility] for follow-up for polyp. Explained to patient that BCCCP will not cover the appointment and gave her information about the [facility] financial assistance. Escorted patient to the Center for [facility] to schedule appointment.    Uterus Uterus is present and palpable. Uterus in normal position and normal size.        Adnexae Bilateral ovaries present and palpable. No tenderness on palpation.         Rectovaginal No rectal exam completed today since patient had no rectal complaints. No skin abnormalities observed on exam.     Smoking History: Patient has never smoked.   Patient Navigation: Patient education provided. Access to services provided for patient through [place] program. Spanish interpreter  Natale Lay from [facility] provided.    Breast and Cervical Cancer Risk Assessment: Patient has no family history of breast cancer, known genetic mutations, or radiation treatment to the chest before age 58. Patient has no history of cervical dysplasia, immunocompromised, or DES exposure in-utero.  Risk Assessment    Risk Scores      08/31/2019 11/24/2018   Last edited by: Priscille Heidelberg, RN Lynnell Dike, LPN   5-year risk: 0.8 % 0.8 %   Lifetime risk: 7.9 % 7.9 %         A: BCCCP exam with pap smear  P: Referred patient to the [facility] Center of [facility] for a left breast diagnostic mammogram and ultrasound. Appointment scheduled Tuesday, Aug 31, 2019 at 1520.  Priscille Heidelberg, RN 08/31/2019 2:59 PM

## 2019-08-31 NOTE — Patient Instructions (Signed)
Explained breast self awareness with Alvino Chapel.  Let patient know BCCCP will cover Pap smears every 3 years unless has a history of abnormal Pap smears. Referred patient to the [facility] Center of [facility] for a left breast diagnostic mammogram and ultrasound. Appointment scheduled Tuesday, Aug 31, 2019 at 1520. Patient aware of appointment and will be there. Let patient know will follow up with her within the next couple weeks with results. Alvino Chapel verbalized understanding.  Abdulkareem Badolato, Kathaleen Maser, RN 2:59 PM

## 2019-09-02 LAB — CYTOLOGY - PAP
Comment: NEGATIVE
Diagnosis: UNDETERMINED — AB
High risk HPV: NEGATIVE

## 2019-09-03 ENCOUNTER — Telehealth: Payer: Self-pay

## 2019-09-03 NOTE — Telephone Encounter (Signed)
Error

## 2019-09-10 ENCOUNTER — Telehealth: Payer: Self-pay

## 2019-09-10 NOTE — Telephone Encounter (Signed)
Via Delorise Royals, Spanish Interpreter, Patient informed Pap is ASC-US with negative HPV. Per Dr. Delfin Gant, this pap is considered to be normal, repeat at usual screening interval within 3 years.

## 2019-09-13 ENCOUNTER — Ambulatory Visit: Payer: No Typology Code available for payment source

## 2019-10-25 ENCOUNTER — Telehealth: Payer: Self-pay

## 2019-10-25 NOTE — Telephone Encounter (Signed)
(  10/20/2019) Via Delorise Royals, Spanish Interpreter, Patient informed Pap results: ASC-US with negative HPV. Per Dr. Macon Large, This is considered and treated as a normal pap smear given negative HPV. Repeat pap at usual screening interval (3-5 years)

## 2019-10-26 ENCOUNTER — Ambulatory Visit
Admission: RE | Admit: 2019-10-26 | Discharge: 2019-10-26 | Disposition: A | Discharge status: Home / Self Care | Payer: No Typology Code available for payment source | Source: Ambulatory Visit | Attending: Obstetrics and Gynecology | Admitting: Obstetrics and Gynecology

## 2019-10-26 ENCOUNTER — Other Ambulatory Visit: Payer: Self-pay

## 2019-10-26 DIAGNOSIS — N6320 Unspecified lump in the left breast, unspecified quadrant: Secondary | ICD-10-CM

## 2019-11-01 ENCOUNTER — Ambulatory Visit: Payer: No Typology Code available for payment source

## 2019-11-02 ENCOUNTER — Other Ambulatory Visit: Payer: Self-pay | Admitting: *Deleted

## 2019-11-02 DIAGNOSIS — Z1231 Encounter for screening mammogram for malignant neoplasm of breast: Secondary | ICD-10-CM

## 2019-12-07 ENCOUNTER — Ambulatory Visit: Payer: No Typology Code available for payment source | Admitting: *Deleted

## 2019-12-07 ENCOUNTER — Ambulatory Visit
Admission: RE | Admit: 2019-12-07 | Discharge: 2019-12-07 | Disposition: A | Discharge status: Home / Self Care | Payer: No Typology Code available for payment source | Source: Ambulatory Visit | Attending: Obstetrics and Gynecology | Admitting: Obstetrics and Gynecology

## 2019-12-07 ENCOUNTER — Other Ambulatory Visit: Payer: Self-pay

## 2019-12-07 VITALS — BP 136/84 | Temp 97.5°F | Wt 168.5 lb

## 2019-12-07 DIAGNOSIS — Z1239 Encounter for other screening for malignant neoplasm of breast: Secondary | ICD-10-CM

## 2019-12-07 DIAGNOSIS — Z1231 Encounter for screening mammogram for malignant neoplasm of breast: Secondary | ICD-10-CM

## 2019-12-07 NOTE — Patient Instructions (Addendum)
Explained breast self awareness with Alvino Chapel. Patient did not need a Pap smear today due to last Pap smear was 08/31/2019. Let patient know that her next Pap smear is due in one year since was abnormal and HPV was negative. Referred patient to the Breast Center of [facility] for a screening mammogram on the mobile unit. Appointment scheduled Tuesday, December 07, 2019 at 0920. Patient escorted to mobile unit for mammogram following BCCCP appointment. Let patient know the Breast Center will follow up with her within the next couple weeks with results of her mammogram by letter or phone. Alvino Chapel verbalized understanding.  Fable Huisman, Kathaleen Maser, RN 8:36 AM

## 2019-12-07 NOTE — Progress Notes (Addendum)
[name] is a 49 y.o. female who presents to [facility] clinic today with no complaints.    Pap Smear: Pap smear not completed today. Last Pap smear was 08/31/2019 at [facility] clinic and was ASCUS with negative HPV. Per patient her most recent Pap smear is the only abnormal Pap smear she has had. Last two Pap smear results is available in Epic.   Physical exam: Breasts Right breast slightly larger than left. No skin abnormalities bilateral breasts. No nipple retraction bilateral breasts. No nipple discharge bilateral breasts. No lymphadenopathy. No lumps palpated bilateral breasts. No complaints of pain or tenderness on exam.   Pelvic/Bimanual Pap is not indicated today per BCCCP guidelines.   Smoking History: Patient has never smoked.   Patient Navigation: Patient education provided. Access to services provided for patient through [place] program. Spanish interpreter Tracey Ortega from [facility] provided.    Breast and Cervical Cancer Risk Assessment: Patient does not have family history of breast cancer, known genetic mutations, or radiation treatment to the chest before age 67. Patient does not have history of cervical dysplasia, immunocompromised, or DES exposure in-utero.  Risk Assessment    Risk Scores      12/07/2019 08/31/2019   Last edited by: Tracey Rutherford, LPN Tracey Ortega, Tracey Grippe, RN   5-year risk: 0.8 % 0.8 %   Lifetime risk: 7.8 % 7.9 %          A: BCCCP exam without pap smear No complaints.  P: Referred patient to the Breast Center of [facility] for a screening mammogram on the mobile unit. Appointment scheduled Tuesday, December 07, 2019 at 0920.  Tracey Heidelberg, RN 12/07/2019 8:36 AM

## 2021-01-29 ENCOUNTER — Other Ambulatory Visit: Payer: Self-pay

## 2021-04-08 HISTORY — PX: EGD: GILAB00003

## 2022-03-21 ENCOUNTER — Other Ambulatory Visit: Payer: Self-pay

## 2022-03-21 DIAGNOSIS — Z1231 Encounter for screening mammogram for malignant neoplasm of breast: Secondary | ICD-10-CM

## 2022-04-08 HISTORY — PX: COLONOSCOPY: GILAB00002

## 2022-04-08 IMAGING — US US AXILLARY LEFT
1 series · 9 of 9 positions shown · non-contrast
Comparison: Previous exam(s).

CLINICAL DATA: 49-year-old female presenting for short-term
follow-up of mildly prominent left axillary lymph nodes, likely
related to COVID vaccine.

EXAM:
ULTRASOUND OF THE LEFT AXILLA

[Series 1: us axillary left · 0.06mm/px · 9 of 9 slices shown]
[im 1/9]
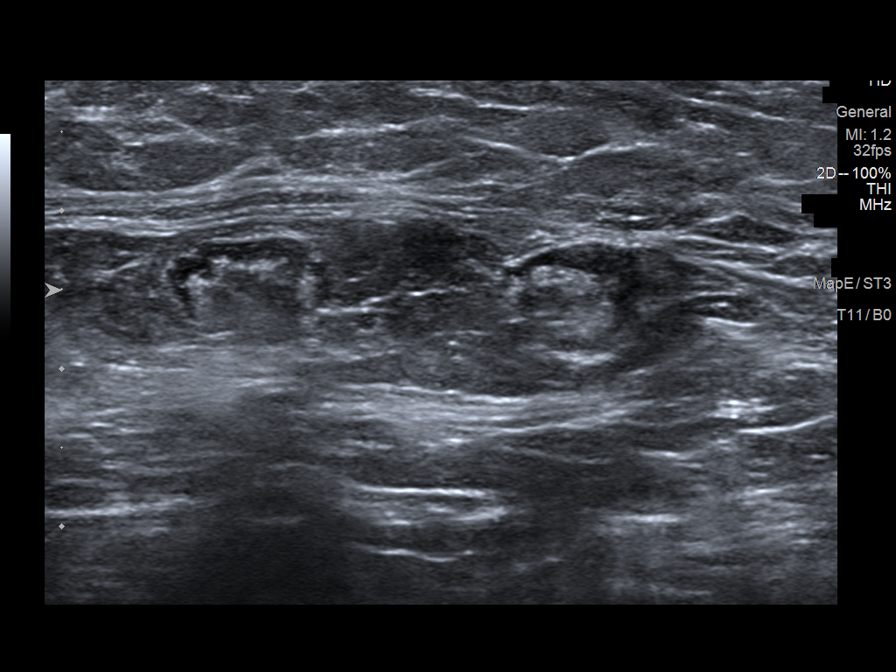
[im 2/9]
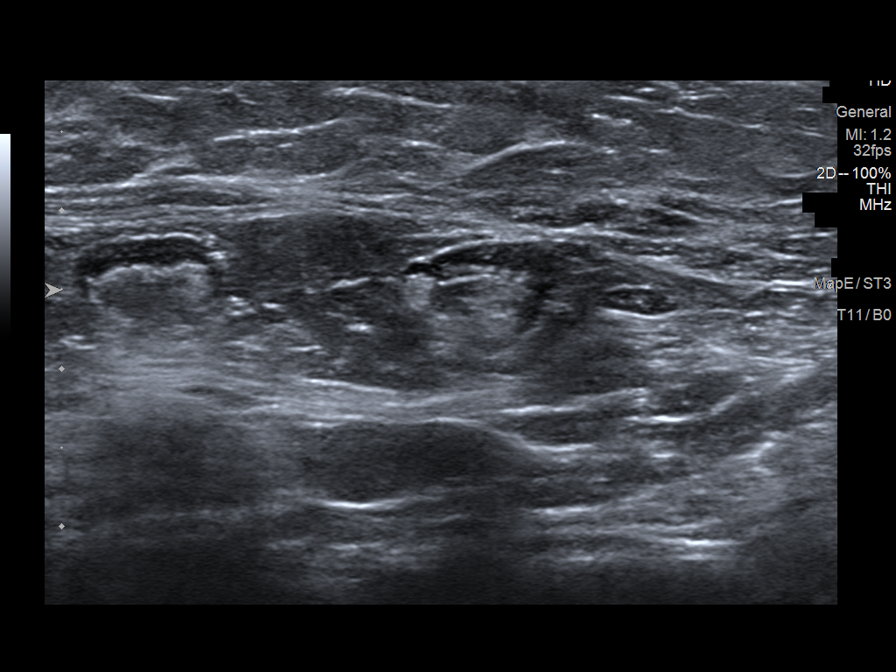
[im 3/9]
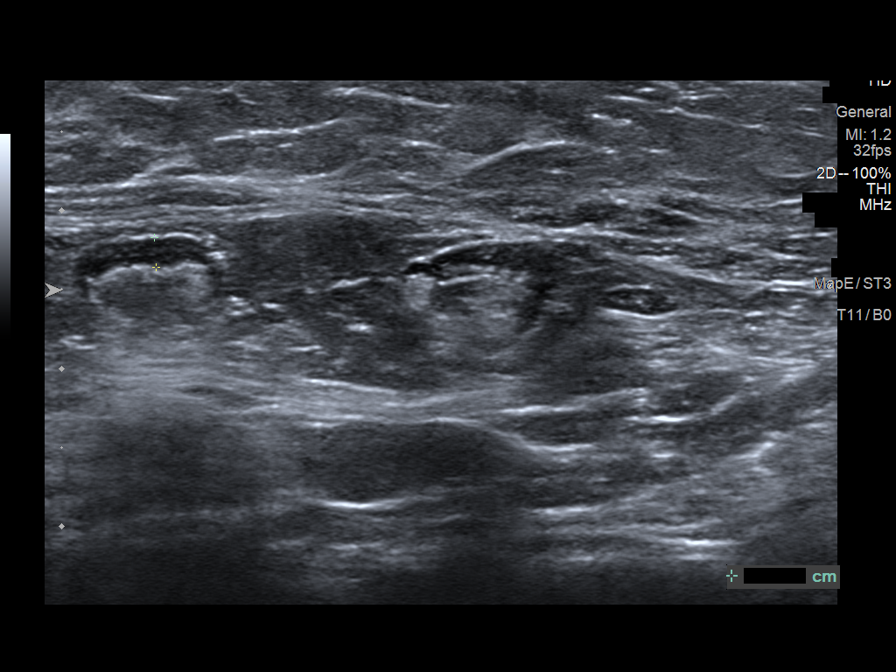
[im 4/9]
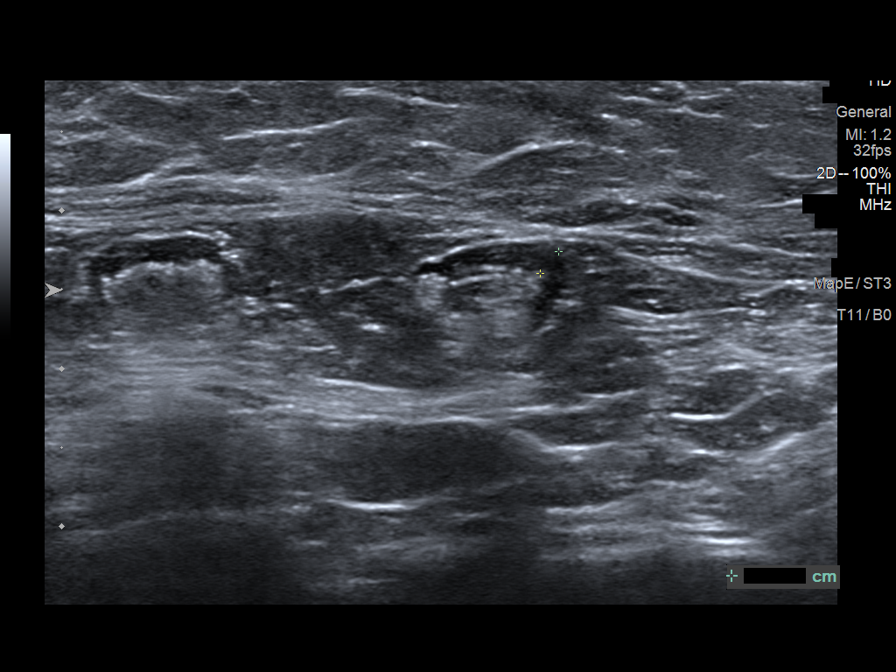
[im 5/9]
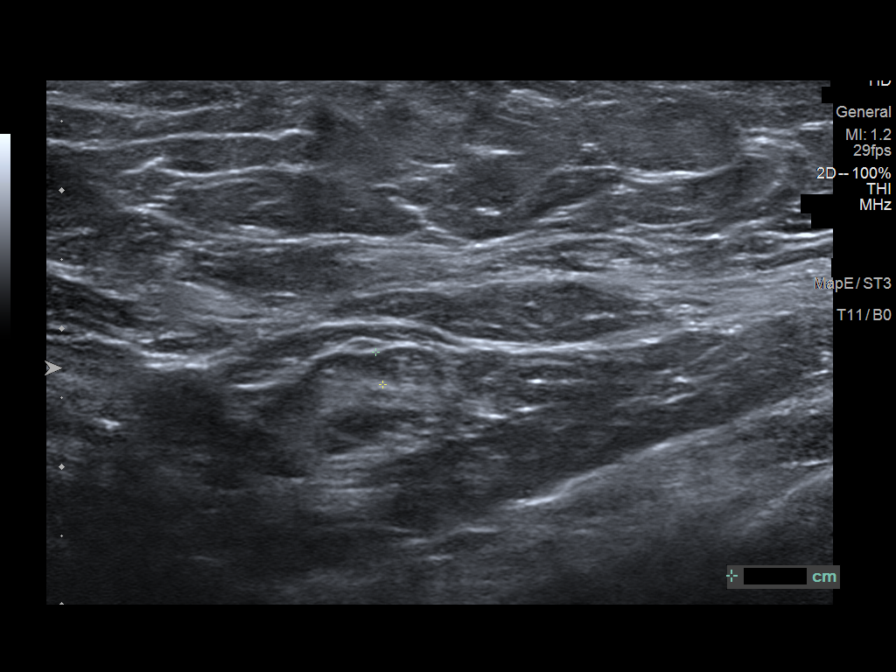
[im 6/9]
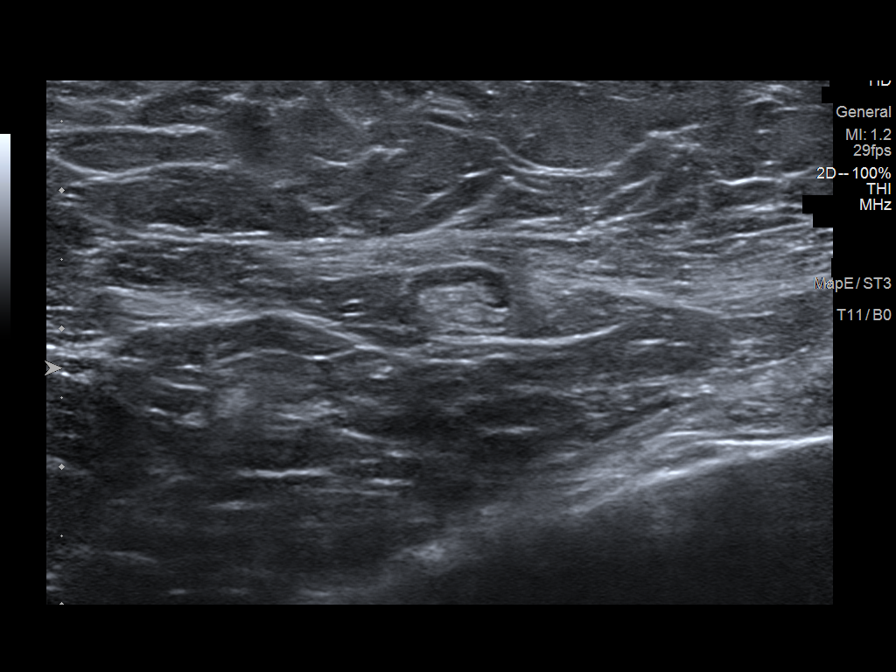
[im 7/9]
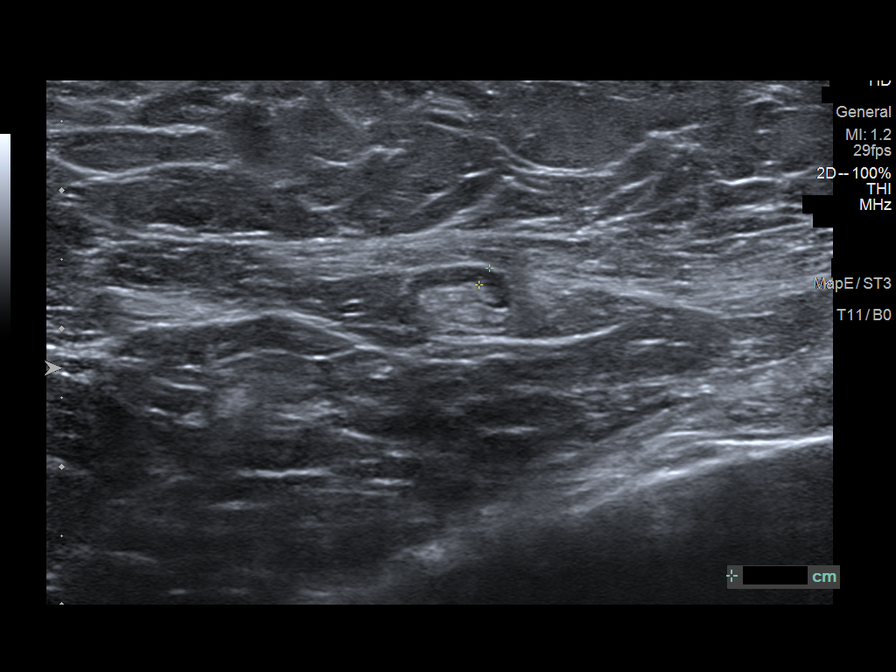
[im 8/9]
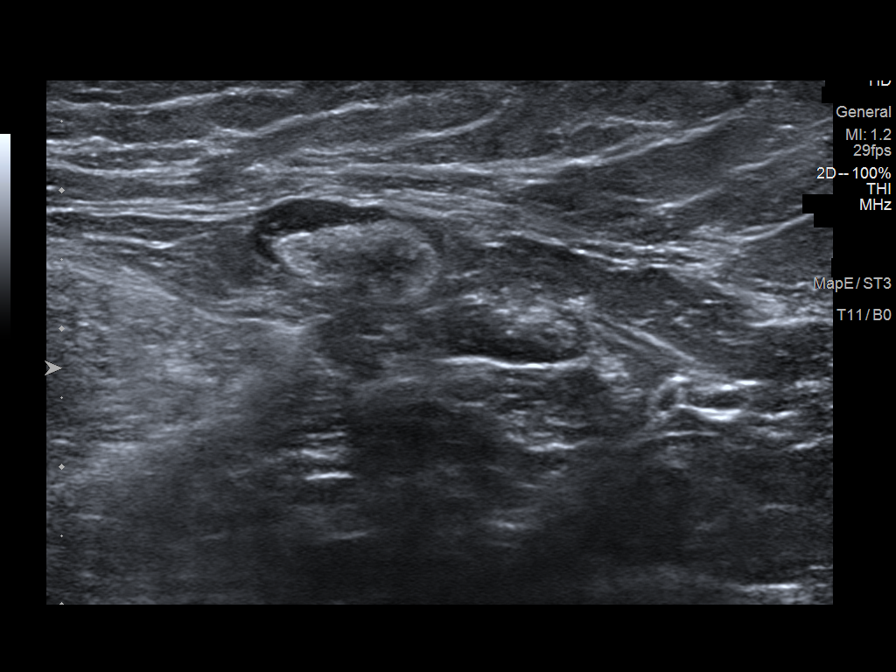
[im 9/9]
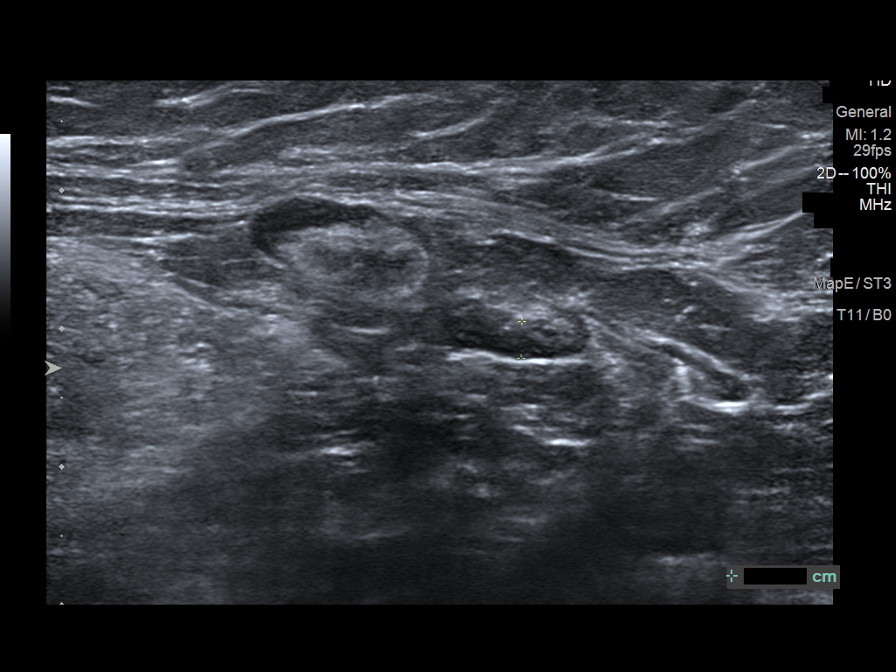

[9 of 9 positions shown; findings below may reference images not displayed]

FINDINGS: Ultrasound is performed, showing interval decrease in mild diffuse
prominence of several left axillary lymph nodes. The lymph nodes all
demonstrate a normal morphology with cortical thickness less than 3
mm.
IMPRESSION: Morphologically normal left axillary lymph nodes. No further imaging
follow-up required.

RECOMMENDATION:
Patient is due for annual bilateral screening mammography in November 2019.

I have discussed the findings and recommendations with the patient.
If applicable, a reminder letter will be sent to the patient
regarding the next appointment.

BI-RADS CATEGORY  2: Benign.

## 2022-05-02 ENCOUNTER — Ambulatory Visit: Payer: No Typology Code available for payment source | Admitting: Hematology and Oncology

## 2022-05-02 ENCOUNTER — Ambulatory Visit: Payer: No Typology Code available for payment source

## 2022-05-02 VITALS — BP 148/100 | Wt 172.0 lb

## 2022-05-02 DIAGNOSIS — Z1231 Encounter for screening mammogram for malignant neoplasm of breast: Secondary | ICD-10-CM

## 2022-05-02 DIAGNOSIS — Z1211 Encounter for screening for malignant neoplasm of colon: Secondary | ICD-10-CM

## 2022-05-02 NOTE — Patient Instructions (Signed)
Badger about self breast awareness and gave educational materials to take home. Patient did not need a Pap smear today due to last Pap smear was in 2021 per patient.  Let her know BCCCP will cover Pap smears every 5 years unless has a history of abnormal Pap smears. Referred patient to the [place] for screening mammogram. Appointment scheduled for 05/02/22. Patient aware of appointment and will be there. Let patient know will follow up with her within the next couple weeks with results. Clyda Greener verbalized understanding.  Melodye Ped, NP 2:05 PM

## 2022-05-02 NOTE — Progress Notes (Signed)
[name] is a 52 y.o. female who presents to [facility] clinic today with no complaints.    Pap Smear: Pap not smear completed today. Last Pap smear was 08/2019 and was normal. Per patient has no history of an abnormal Pap smear. Last Pap smear result is available in Epic.   Physical exam: Breasts Breasts symmetrical. No skin abnormalities bilateral breasts. No nipple retraction bilateral breasts. No nipple discharge bilateral breasts. No lymphadenopathy. No lumps palpated bilateral breasts.     MS DIGITAL SCREENING TOMO BILATERAL  Result Date: 12/08/2019 CLINICAL DATA:  Screening. EXAM: DIGITAL SCREENING BILATERAL MAMMOGRAM WITH TOMO AND CAD COMPARISON:  Previous exam(s). ACR Breast Density Category c: The breast tissue is heterogeneously dense, which may obscure small masses. FINDINGS: There are no findings suspicious for malignancy. Images were processed with CAD. IMPRESSION: No mammographic evidence of malignancy. A result letter of this screening mammogram will be mailed directly to the patient. RECOMMENDATION: Screening mammogram in one year. (Code:SM-B-01Y) BI-RADS CATEGORY  1: Negative. Electronically Signed   By: Nolon Nations M.D.   On: 12/08/2019 11:48   MS DIGITAL DIAG TOMO UNI LEFT  Result Date: 08/31/2019 CLINICAL DATA:  Patient presents for palpable abnormality within the left axilla. Patient noticed axillary swelling shortly after COVID vaccination within the left upper extremity. EXAM: DIGITAL DIAGNOSTIC LEFT MAMMOGRAM WITH CAD AND TOMO ULTRASOUND LEFT BREAST COMPARISON:  Previous exam(s). ACR Breast Density Category c: The breast tissue is heterogeneously dense, which may obscure small masses. FINDINGS: Two adjacent prominent lymph nodes are demonstrated within the left axilla at the site of palpable concern. No additional masses, calcifications or distortion identified within the left breast. Mammographic images were processed with CAD. On physical exam, no discrete mass  is palpated within the left axilla. Targeted ultrasound is performed, showing two adjacent mildly thickened nodes within the left axilla measuring 5 mm each. IMPRESSION: There are 2 mildly thickened nodes within the left axilla at the site of focal tenderness, potentially reactive from recent COVID vaccination. RECOMMENDATION: Left axillary ultrasound in 8 weeks to reassess the mildly thickened left axillary lymph nodes. I have discussed the findings and recommendations with the patient. If applicable, a reminder letter will be sent to the patient regarding the next appointment. BI-RADS CATEGORY  3: Probably benign. Electronically Signed   By: Lovey Newcomer M.D.   On: 08/31/2019 16:05   MS DIGITAL SCREENING TOMO BILATERAL  Result Date: 11/24/2018 CLINICAL DATA:  Screening. EXAM: DIGITAL SCREENING BILATERAL MAMMOGRAM WITH TOMO AND CAD COMPARISON:  Previous exam(s). ACR Breast Density Category d: The breast tissue is extremely dense, which lowers the sensitivity of mammography FINDINGS: There are no findings suspicious for malignancy. Images were processed with CAD. IMPRESSION: No mammographic evidence of malignancy. A result letter of this screening mammogram will be mailed directly to the patient. RECOMMENDATION: Screening mammogram in one year. (Code:SM-B-01Y) BI-RADS CATEGORY  1: Negative. Electronically Signed   By: Evangeline Dakin M.D.   On: 11/24/2018 16:16   MS DIGITAL SCREENING TOMO BILATERAL  Result Date: 08/26/2017 CLINICAL DATA:  Screening. EXAM: DIGITAL SCREENING BILATERAL MAMMOGRAM WITH TOMO AND CAD COMPARISON:  Previous exam(s). ACR Breast Density Category c: The breast tissue is heterogeneously dense, which may obscure small masses. FINDINGS: There are no findings suspicious for malignancy. Images were processed with CAD. IMPRESSION: No mammographic evidence of malignancy. A result letter of this screening mammogram will be mailed directly to the patient. RECOMMENDATION: Screening mammogram in  one year. (Code:SM-B-01Y) BI-RADS CATEGORY  1:  Negative. Electronically Signed   By: Lajean Manes M.D.   On: 08/26/2017 14:09      Pelvic/Bimanual Pap is not indicated today    Smoking History: Patient has never smoked and was not referred to quit line.    Patient Navigation: Patient education provided. Access to services provided for patient through [facility] program. No interpreter provided. No transportation provided   Colorectal Cancer Screening: Per patient has never had colonoscopy completed No complaints today. FIT test given   Breast and Cervical Cancer Risk Assessment: Patient does not have family history of breast cancer, known genetic mutations, or radiation treatment to the chest before age 75. Patient does not have history of cervical dysplasia, immunocompromised, or DES exposure in-utero.  Risk Scores as of 05/02/2022     Tracey Ortega           5-year 1.23 %   Lifetime 10.64 %   This patient is [place]/Lat[place] but has no documented birth country, so the [place] used data from [place] patients to calculate their risk score. Document a birth country in the Demographics activity for a more accurate score.         Last calculated by Royston Bake, CMA on 05/02/2022 at  1:30 PM        A: BCCCP exam without pap smear No complaints with benign exam.   P: Referred patient to the [facility] for a screening mammogram. Appointment scheduled 05/02/22.  Dayton Scrape A, NP 05/02/2022 2:01 PM

## 2022-05-07 ENCOUNTER — Ambulatory Visit
Admission: RE | Admit: 2022-05-07 | Discharge: 2022-05-07 | Disposition: A | Discharge status: Home / Self Care | Payer: No Typology Code available for payment source | Source: Ambulatory Visit | Attending: Obstetrics and Gynecology | Admitting: Obstetrics and Gynecology

## 2022-05-07 DIAGNOSIS — Z1231 Encounter for screening mammogram for malignant neoplasm of breast: Secondary | ICD-10-CM

## 2022-09-01 ENCOUNTER — Encounter: Payer: Self-pay | Admitting: *Deleted

## 2022-10-25 NOTE — Progress Notes (Signed)
Received faxed from [facility] stating images for CT Head completed on 01/29/21 was pushed to [facility]. Message sent to film IT to merge.

## 2022-10-31 NOTE — Progress Notes (Signed)
PATIENT:  Jeanette Combs, Jeanette Combs   MR #:  [id number]   DOB:  1971/01/11  SEX:  female   AGE: 77yr   SERVICE DATE:  11/01/2022     OTOLARYNGOLOGY CLINIC NOTE     REFERRING PHYSICIAN: ENT: Mickle Asper, MD     CC: Right frontal osteoma      HISTORY OF PRESENT ILLNESS:  Karrina Lye is a 52yr old female who presents to [facility] with right frontal osteoma.    CT 01/29/21:      Per medical records, patient was referred to our clinic for right frontal osteoma by Dr. Nanetta Batty to discuss endoscopic approach to removal of right frontal sinus osteoma. Patient states the right frontal sinus osteoma for 10 years that has been stable and slow growing. The area has become tender recently. She has been wearing hats more often due to being outdoors this summer. Patient is interested in addressing the osteoma.         No past medical history on file.   No past surgical history on file.   No current outpatient medications on file.     No current facility-administered medications for this visit.      Allergies:  Not on File   No family history on file.   Social History     Socioeconomic History    Marital status: UNKNOWN     Social Determinants of Health     Food Insecurity: No Food Insecurity (10/01/2022)    Received from [facility]    Hunger Vital Sign     Worried About Running Out of Food in the Last Year: Never true     Ran Out of Food in the Last Year: Never true   Intimate Partner Violence: Not At Risk (10/01/2022)    Received from [facility]    Humiliation, Afraid, Rape, and Kick questionnaire     Fear of Current or Ex-Partner: No     Emotionally Abused: No     Physically Abused: No     Sexually Abused: No       REVIEW OF SYSTEMS  All systems were reviewed and were negative except for those mentioned in the HPI.        PHYSICAL EXAMINATION:    There were no vitals taken for this visit.     GENERAL:  The patient is a well-nourished, well-developed female sitting comfortably in no acute distress.     SKIN:  The  skin is warm and dry.      MENTAL STATUS:  Alert and oriented to person, place and time.     HEENT:     Head: 1.5 x 1.5 cm frontal osteoma. Mildly tender to palpation.      Eyes:  Extraocular movements intact. Conjunctiva and sclera were clear.       Ears:   Right external auditory canal is clear, tympanic membrane intact and without evidence of middle ear effusion or infection.     Left external auditory canal is clear, tympanic membrane intact and without evidence of middle ear effusion or infection.        Nose: Anterior rhinoscopy: reveals moist mucous membranes.     Oral Cavity/oropharynx:  The buccal mucosa, lips, gingiva, retromolar trigone, alveolar ridge, floor of mouth, tongue and palate are normal and without evidence of lesions or ulcerations.      Neck:  Supple.  Endocrine:  No thyromegaly.  Lymphatics:  No cervical lymphadenopathy.  Respiratory:  The patient is  breathing comfortably in no acute distress.  Neurologic exam:  Cranial nerves II-XII are grossly intact.     I personally reviewed the CT imaging (01/29/2021) and used the findings in our medical decision making:  Frontal osteoma.       ASSESSMENT/PLAN:  1. Right frontal osteoma:  - Patient is interested in removal.   - The benefits, risks, and realistic outcomes of removal under procedural sedation vs general anaesthesia were discussed thoroughly. All of the patient's questions were answered.    - We discussed an endoscopic vs direct approach.   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   - Will complete the consent to photograph and will take pictures before procedure.      I spent a total of 20 minutes (excluding time spent on other billable services) which includes face-to-face time and non-face-to-face time spent on preparing to see the patient, performing a medically appropriate exam, counseling and educating the patient, ordering medications/tests/procedures/referrals as  clinically indicated, and documenting information in the electronic medical record.       FOLLOW UP:  Preoperative exam. The patient will phone with any questions in the interim.        Barriers to Learning assessed: none. Patient verbalizes understanding of teaching and instructions.    SCRIBE STATEMENT  I, Kelli Churn, SCRIBE,  am personally taking down the notes in the presence of Dr. Barrington [place].  Electronically signed by Kelli Churn, SCRIBE, 11/01/2022  11:46 AM   PROVIDER DISCLAIMER:  This document serves as my personal record of services taken in my  presence. It was created on 11/01/2022 on my behalf by the scribe mentioned above, a trained medical scribe, and it's both accurate and complete.    11/01/2022  Signed,  Briant Sites, MD  Professor   Dept of Otolaryngology  [place] [facility] [place], [facility] [place]  [address]  [place], [place] [address]   T: [phone]  F: [phone]

## 2022-11-01 ENCOUNTER — Ambulatory Visit: Payer: BLUE CROSS/BLUE SHIELD | Attending: Otolaryngology | Admitting: Otolaryngology

## 2022-11-01 ENCOUNTER — Encounter: Payer: Self-pay | Admitting: Otolaryngology

## 2022-11-01 VITALS — BP 100/67 | HR 75 | Temp 96.0°F | Resp 20

## 2022-11-01 DIAGNOSIS — D169 Benign neoplasm of bone and articular cartilage, unspecified: Secondary | ICD-10-CM | POA: Insufficient documentation

## 2022-11-01 NOTE — Nursing Note (Signed)
Patient identification verified x 2 Jeanette Combs September 24, 1970). Patient roomed, appropriate vitals obtained, pain level and barriers assessed, and  medications reconciled.        Earl Many, LVN

## 2022-11-27 ENCOUNTER — Other Ambulatory Visit: Payer: Self-pay | Admitting: Surgery

## 2022-11-27 DIAGNOSIS — R159 Full incontinence of feces: Secondary | ICD-10-CM

## 2023-05-01 ENCOUNTER — Telehealth: Payer: Self-pay

## 2023-05-01 NOTE — Transitions of Care (Post Inpatient/ED Visit) (Signed)
   05/01/2023  Name: Tracey Ortega MRN: [id number] DOB: 08-Jan-1971  Today's TOC FU Call Status: Today's TOC FU Call Status:: Unsuccessful Call (1st Attempt) Unsuccessful Call (1st Attempt) Date: 05/01/23  Attempted to reach the patient regarding the most recent Inpatient/ED visit.  Follow Up Plan: Additional outreach attempts will be made to reach the patient to complete the Transitions of Care (Post Inpatient/ED visit) call.   Signature  Karena Addison, LPN [facility] Nurse Health Advisor Direct Dial [phone]

## 2023-05-05 NOTE — Transitions of Care (Post Inpatient/ED Visit) (Signed)
   05/05/2023  Name: Tracey Ortega MRN: [id number] DOB: [birth year]  Today's TOC FU Call Status: Today's TOC FU Call Status:: Successful TOC FU Call Completed Unsuccessful Call (1st Attempt) Date: 05/01/23 [facility] FU Call Complete Date: 05/05/23 Patient's Name and Date of Birth confirmed.  Transition Care Management Follow-up Telephone Call Date of Discharge: 04/30/23 Discharge Facility: Other ([facility] Facility) Name of Other ([facility]) Discharge Facility: [facility] Type of Discharge: Inpatient Admission Primary Inpatient Discharge Diagnosis:: sciatica How have you been since you were released from the hospital?: Better Any questions or concerns?: No  Items Reviewed: Did you receive and understand the discharge instructions provided?: Yes Medications obtained,verified, and reconciled?: Yes (Medications Reviewed) Any new allergies since your discharge?: No Dietary orders reviewed?: Yes Do you have support at home?: Yes People in Home: spouse  Medications Reviewed Today: Medications Reviewed Today     Reviewed by Karena Addison, LPN (Licensed Practical Nurse) on 05/05/23 at 1635  Med List Status: <None>   Medication Order Taking? Sig Documenting Provider Last Dose Status Informant  Multiple Vitamin (MULTIVITAMIN) capsule [id number] No Take 1 capsule by mouth daily. [provider] Taking Active Self            Home Care and Equipment/Supplies: Were Home Health Services Ordered?: NA Any new equipment or medical supplies ordered?: NA  Functional Questionnaire: Do you need assistance with bathing/showering or dressing?: No Do you need assistance with meal preparation?: No Do you need assistance with eating?: No Do you have difficulty maintaining continence: No Do you need assistance with getting out of bed/getting out of a chair/moving?: No Do you have difficulty managing or taking your medications?: No  Follow up appointments reviewed: PCP  Follow-up appointment confirmed?: NA Specialist Hospital Follow-up appointment confirmed?: NA Do you need transportation to your follow-up appointment?: No Do you understand care options if your condition(s) worsen?: Yes-patient verbalized understanding    SIGNATURE Karena Addison, LPN [facility] Nurse Health Advisor Direct Dial [phone]

## 2023-06-27 ENCOUNTER — Telehealth: Payer: Self-pay | Admitting: Adult Health

## 2023-06-27 NOTE — Telephone Encounter (Signed)
 Motility Pre GI procedure call : Left message on patient's voicemail regarding upcoming GI appointment. Instructed to read instructions at least a week prior to appointment and arrive fifteen minutes early.  If any Covid or Flu like symptoms call [facility] Gastroenterology Department until 4:30pm M-F at [phone] option # 3. Instructed to expect to be at their appointment for 1 hour.  Patient is scheduled for a Rmano 4/1.      Coralie Carpen, LVN

## 2023-07-08 ENCOUNTER — Ambulatory Visit
Admission: RE | Admit: 2023-07-08 | Discharge: 2023-07-08 | Disposition: A | Discharge status: Home / Self Care | Payer: BLUE CROSS/BLUE SHIELD | Source: Ambulatory Visit | Attending: Gastroenterology | Admitting: Gastroenterology

## 2023-07-08 DIAGNOSIS — R159 Full incontinence of feces: Secondary | ICD-10-CM | POA: Insufficient documentation

## 2023-07-08 DIAGNOSIS — K6289 Other specified diseases of anus and rectum: Secondary | ICD-10-CM | POA: Insufficient documentation

## 2023-07-08 HISTORY — PX: MANOMETRY, ANORECTAL: SHX003616

## 2023-07-08 HISTORY — DX: Essential (primary) hypertension: I10

## 2023-07-08 HISTORY — DX: Calculus of kidney: N20.0

## 2023-07-08 HISTORY — DX: Malignant neoplasm of unspecified kidney, except renal pelvis: C64.9

## 2023-07-08 HISTORY — DX: Metabolic syndrome: E88.810

## 2023-07-08 HISTORY — DX: Sleep apnea, unspecified: G47.30

## 2023-07-08 NOTE — Nursing Note (Signed)
 Anorectal Manometry Procedure Note  Provider(s): Thomasenia Sales, NP and Connye Burkitt, RN       I.D. verified (2 identifiers): Name and DOB    Vital signs: refer to navigator section under the Minor Procedure tab  Height: 80ft 8in (reported)  Weight: 176 lbs (reported)    Pain: refer to navigator section under the Minor Procedure tab  Allergies: reviewed in EMR  Medications: reviewed in EMR    PMH: reviewed in EMR  Surgical Hx: reviewed in EMR  Social Hx: reviewed in EMR      Enema today prior to arrival: Yes  Wearing any medication patches today: No     The sheath and attached balloon were applied and the probe was calibrated per manufacturer's guideline.  She was queried for procedure contraindications and none were identified.  The risks, benefits, and alternatives of anorectal manometry were discussed including, but not limited to, the risk of perforation, bleeding, and pain/discomfort. Her questions were answered, and the procedure consent was signed.    After removing clothing from below the waist and donning a gown, she laid on the gurney in the left lateral position with knees bent. At 10:18, the manometry preprocedure was started. A rectal exam was performed, then she was asked to perform relaxation, squeeze, and beardown maneuvers. The rectal probe was then gently inserted into the anorectal area. The probe was stabilized by placing a rolled towel against the patient's buttocks and placing gentle pressure on probe handle to minimize movement of the probe and pressure wave tracing during the procedure recording. After obtaining a baseline resting measurement, she performed the required maneuvers for the procedure. The manometry pressure wave was captured with each maneuver. The procedure end time was: 10:38 a.m.Marland Kitchen  After the procedure was complete, the balloon on the end of the probe was deflated completely prior to gently removing the probe from her anorectal area. The probe balloon was inspected after withdrawal and  noted to be intact. No immediate complications were noted.      She was informed she could resume her usual diet, medications, and activities after discharge. She verbalized understanding. After changing back into her street clothes, she was discharged from the procedural area at 10:41 a.m., in no apparent distress.    Dara Hoyer, NP  Division of Gastroenterology  PI# [id number]

## 2023-07-08 NOTE — Discharge Instructions (Signed)
 Discharge Instructions    You can resume your pre-procedure diet, medications, and activities.   The rectal manometry procedure result should be released to you via My Chart.  Follow up with Mal Misty, MD.  Follow up with your primary care provider.

## 2023-07-22 ENCOUNTER — Other Ambulatory Visit: Payer: Self-pay | Admitting: Obstetrics and Gynecology

## 2023-07-22 DIAGNOSIS — Z1231 Encounter for screening mammogram for malignant neoplasm of breast: Secondary | ICD-10-CM

## 2023-10-21 ENCOUNTER — Ambulatory Visit: Payer: Self-pay | Admitting: *Deleted

## 2023-10-21 ENCOUNTER — Ambulatory Visit
Admission: RE | Admit: 2023-10-21 | Discharge: 2023-10-21 | Disposition: A | Discharge status: Home / Self Care | Source: Ambulatory Visit | Attending: Obstetrics and Gynecology | Admitting: Obstetrics and Gynecology

## 2023-10-21 VITALS — Wt 153.0 lb

## 2023-10-21 DIAGNOSIS — Z01419 Encounter for gynecological examination (general) (routine) without abnormal findings: Secondary | ICD-10-CM

## 2023-10-21 DIAGNOSIS — Z1211 Encounter for screening for malignant neoplasm of colon: Secondary | ICD-10-CM

## 2023-10-21 DIAGNOSIS — Z1231 Encounter for screening mammogram for malignant neoplasm of breast: Secondary | ICD-10-CM

## 2023-10-21 NOTE — Progress Notes (Signed)
 [name] is a 53 y.o. G2P2002 female who presents to [facility] clinic today with no complaints.    Pap Smear: Pap smear completed today. Last Pap smear was 08/31/2019 at [facility] clinic and was ASCUS with negative HPV. Per patient her most recent Pap smear is the only abnormal Pap smear she has had. Last two Pap smear results is available in Epic.    Physical exam: Breasts Right breast slightly larger than left. No skin abnormalities bilateral breasts. No nipple retraction bilateral breasts. No nipple discharge bilateral breasts. No lymphadenopathy. No lumps palpated bilateral breasts. No complaints of pain or tenderness on exam.   MS DIGITAL SCREENING TOMO BILATERAL Result Date: 05/09/2022 CLINICAL DATA:  Screening. EXAM: DIGITAL SCREENING BILATERAL MAMMOGRAM WITH TOMOSYNTHESIS AND CAD TECHNIQUE: Bilateral screening digital craniocaudal and mediolateral oblique mammograms were obtained. Bilateral screening digital breast tomosynthesis was performed. The images were evaluated with computer-aided detection. COMPARISON:  Previous exam(s). ACR Breast Density Category c: The breast tissue is heterogeneously dense, which may obscure small masses. FINDINGS: There are no findings suspicious for malignancy. IMPRESSION: No mammographic evidence of malignancy. A result letter of this screening mammogram will be mailed directly to the patient. RECOMMENDATION: Screening mammogram in one year. (Code:SM-B-01Y) BI-RADS CATEGORY  1: Negative. Electronically Signed   By: Inocente Ast M.D.   On: 05/09/2022 10:26   MS DIGITAL SCREENING TOMO BILATERAL Result Date: 12/08/2019 CLINICAL DATA:  Screening. EXAM: DIGITAL SCREENING BILATERAL MAMMOGRAM WITH TOMO AND CAD COMPARISON:  Previous exam(s). ACR Breast Density Category c: The breast tissue is heterogeneously dense, which may obscure small masses. FINDINGS: There are no findings suspicious for malignancy. Images were processed with CAD. IMPRESSION: No  mammographic evidence of malignancy. A result letter of this screening mammogram will be mailed directly to the patient. RECOMMENDATION: Screening mammogram in one year. (Code:SM-B-01Y) BI-RADS CATEGORY  1: Negative. Electronically Signed   By: Almarie Daring M.D.   On: 12/08/2019 11:48   MS DIGITAL DIAG TOMO UNI LEFT Result Date: 08/31/2019 CLINICAL DATA:  Patient presents for palpable abnormality within the left axilla. Patient noticed axillary swelling shortly after COVID vaccination within the left upper extremity. EXAM: DIGITAL DIAGNOSTIC LEFT MAMMOGRAM WITH CAD AND TOMO ULTRASOUND LEFT BREAST COMPARISON:  Previous exam(s). ACR Breast Density Category c: The breast tissue is heterogeneously dense, which may obscure small masses. FINDINGS: Two adjacent prominent lymph nodes are demonstrated within the left axilla at the site of palpable concern. No additional masses, calcifications or distortion identified within the left breast. Mammographic images were processed with CAD. On physical exam, no discrete mass is palpated within the left axilla. Targeted ultrasound is performed, showing two adjacent mildly thickened nodes within the left axilla measuring 5 mm each. IMPRESSION: There are 2 mildly thickened nodes within the left axilla at the site of focal tenderness, potentially reactive from recent COVID vaccination. RECOMMENDATION: Left axillary ultrasound in 8 weeks to reassess the mildly thickened left axillary lymph nodes. I have discussed the findings and recommendations with the patient. If applicable, a reminder letter will be sent to the patient regarding the next appointment. BI-RADS CATEGORY  3: Probably benign. Electronically Signed   By: Bard Moats M.D.   On: 08/31/2019 16:05   MS DIGITAL SCREENING TOMO BILATERAL Result Date: 11/24/2018 CLINICAL DATA:  Screening. EXAM: DIGITAL SCREENING BILATERAL MAMMOGRAM WITH TOMO AND CAD COMPARISON:  Previous exam(s). ACR Breast Density Category d: The  breast tissue is extremely dense, which lowers the sensitivity of mammography FINDINGS: There are no  findings suspicious for malignancy. Images were processed with CAD. IMPRESSION: No mammographic evidence of malignancy. A result letter of this screening mammogram will be mailed directly to the patient. RECOMMENDATION: Screening mammogram in one year. (Code:SM-B-01Y) BI-RADS CATEGORY  1: Negative. Electronically Signed   By: Debby Satterfield M.D.   On: 11/24/2018 16:16    Pelvic/Bimanual Ext Genitalia No lesions, no swelling and no discharge observed on external genitalia.        Vagina Vagina pink and normal texture. No lesions or discharge observed in vagina.        Cervix Cervix is present. Cervix pink and of normal texture. Cervical polyp observed at os. Referred to the [facility] for [facility] for follow up. Let patient know B[facility]CP will not cover the follow up. Gave information about the [facility] financial assistance. No discharge observed.    Uterus Uterus is present and palpable. Uterus in normal position and normal size.        Adnexae Bilateral ovaries present and palpable. No tenderness on palpation.         Rectovaginal No rectal exam completed today since patient had no rectal complaints. No skin abnormalities observed on exam.     Smoking History: Patient has never smoked.   Patient Navigation: Patient education provided. Access to services provided for patient through BCCCP program.   Colorectal Cancer Screening: Per patient has never had colonoscopy completed. FIT Test given to patient to complete. No complaints today.    Breast and Cervical Cancer Risk Assessment: Patient does not have family history of breast cancer, known genetic mutations, or radiation treatment to the chest before age 47. Patient does not have history of cervical dysplasia, immunocompromised, or DES exposure in-utero.  Risk Scores as of Encounter on 10/21/2023     Tracey Ortega as of  05/02/2022           5-year 1.23%   Lifetime 10.64%   This patient is [place]/Lat[place] but has no documented birth country, so the Nash model used data from [place] patients to calculate their risk score. Document a birth country in the Demographics activity for a more accurate score.         Last calculated by Logan Lyle BRAVO, CMA on 05/02/2022 at  1:30 PM        A: BCCCP exam with pap smear No complaints.  P: Referred patient to the [facility] for a screening mammogram on mobile unit. Appointment scheduled Tuesday, October 21, 2023 at 1030.  Driscilla Wanda SQUIBB, RN 10/21/2023 9:53 AM

## 2023-10-21 NOTE — Patient Instructions (Signed)
 Explained breast self awareness with Hadassah Tully Code. Pap smear completed today. Let her know BCCCP will cover Pap smears and HPV typing every 5 years unless has a history of abnormal Pap smears. Referred patient to the [facility] for a screening mammogram on mobile unit. Appointment scheduled Tuesday, October 21, 2023 at 1030. Patient aware of appointment and will be there. Let patient know will follow up with her within the next couple weeks with results of Pap smear by letter or phone. Informed patient that the Breast Center will follow up with her within the next couple of weeks with results of her mammogram by letter or phone. Hadassah Tully Code verbalized understanding.  Oswaldo Cueto, Wanda Ship, RN 9:53 AM

## 2023-10-23 LAB — CYTOLOGY - PAP
Comment: NEGATIVE
Diagnosis: UNDETERMINED — AB
High risk HPV: NEGATIVE

## 2023-10-25 LAB — FECAL OCCULT BLOOD, IMMUNOCHEMICAL: Fecal Occult Bld: NEGATIVE

## 2023-10-25 LAB — SPECIMEN STATUS REPORT

## 2023-11-03 ENCOUNTER — Ambulatory Visit: Payer: Self-pay

## 2023-12-15 ENCOUNTER — Ambulatory Visit: Admitting: Obstetrics and Gynecology

## 2023-12-15 ENCOUNTER — Ambulatory Visit

## 2023-12-15 VITALS — BP 108/73 | HR 84 | Temp 98.3°F | Resp 16 | Wt 178.4 lb

## 2023-12-15 DIAGNOSIS — R159 Full incontinence of feces: Secondary | ICD-10-CM | POA: Insufficient documentation

## 2023-12-15 NOTE — Nursing Note (Signed)
 Temp: 36.8 C (98.3 F) (09/08 1334)  Temp src: Temporal (09/08 1334)  Pulse: 84 (09/08 1334)  BP: 108/73 (09/08 1334)  Resp: 16 (09/08 1334)  SpO2: --  Height: --  Weight: 80.9 kg (178 lb 5.6 oz) (09/08 1334)    Vital signs taken, chief complaint noted, allergies verified and screened for pain.       Rosina Quin, [place]  Outpatient Surgery   [phone]

## 2023-12-15 NOTE — Patient Instructions (Signed)
 WHAT TO DO BEFORE YOU HAVE SURGERY  Step by Step Instructions - NPO     You've been scheduled for Sacral Nerve Stimulator - 2 Step Procedure.  This will be with Dr. Alyce Greet on Dates To Be Determined (DTBD).      At the [facility]:    [facility]X Complex          THINGS TO DO PRIOR TO YOUR SURGERY      Blood Thinning Products: If you are currently taking any prescribed, over the counter, or herbal substances known to thin the blood please ensure your surgical provider as well as prescribing provider have been made aware and have advised you of how to adjust these medications.  In general over the counter medication/herbal supplements are asked to be held for two weeks prior to your procedure, prescription medication should not be stopped until instructed by your prescribing provider/surgical provider (or representative) as to how to hold this medication (DO NOT STOP PRESCRIPTION MEDICATION WITHOUT BEING INSTRUCTED BY A MEDICAL PROVIDER).    Labs & Diagnostics: If you've been ordered labs, imaging, or diagnostics to be completed prior to your procedure please ensure to contact the appropriate departments below to schedule.  Labs can be collected at any [place] lab.  If collecting labs outside of [place] the results must be obtained and faxed to the clinic at [phone] to be uploaded into your chart prior to your surgical procedure (at least 48 hrs prior to procedure to prevent delay or cancellation of your surgical procedure).  If diagnostic imaging has been ordered please call radiology (see number below) to schedule as soon as possible to prevent delay or cancellation of your surgical procedure.  If you're completing imaging outside of [place] you will need to obtain a CD of images and bring (or send) to clinic for uploading into your chart prior to your procedure as soon as possible to prevent delay or cancellation of your surgical procedure.    Are you taking a GLP-1 Agonists (Ozempic, Wegovy, Trulicity, etc)? Please  advise your Colorectal Surgery Team ASAP to obtain the appropriate instructions regarding your diet the day prior to surgery to prevent your surgery from being cancelled!    Two days prior to your procedure: TBD    Start showering daily with Dial Antibacterial Soap to minimize the bacteria on your skin and help prevent infection.  After showering on the DAY OF your procedure do not apply anything to the skin (including but not limited to cosmetics/lotion/cream/deodorant) as this will reintroduce bacteria onto your skin.      One day prior to your procedure: TBD  You may eat your regular diet up until midnight unless you are taking a GLP-1 Agonists (Ozempic, Wegovy, Trulicity, etc), please advise your Colorectal Surgery Team ASAP to obtain the appropriate instructions regarding your diet the day prior to surgery to prevent your surgery from being cancelled!    NPO:   Starting at midnight the night prior to your procedure you are to have Nothing to Eat or Drink (including candy/mint/gum gelatin or broth). You may have clear liquids (NO food, fiber, pulp, or dairy and liquids must be able to have light pass through them) to include water, juice (ie apple, cranberry) or black decaffeinated coffee/tea with nothing in it up until 3 hours prior to your arrival.  NO BROTH/NO GELATIN allowed as they contain fat and fat interferes with anesthesia.  If you drink broth or eat gelatin after midnight your surgical procedure  will be cancelled.      Day of your procedure: TBD    You will need to arrive 2 hours prior to the time your surgery is scheduled.  You will be contacted by the Surgery Scheduling office the day prior to your procedure (Friday for Monday cases) to be notified of the arrival time (please see below for further information).      In general get good rest, good nutrition, hydrate drinking at least 64oz of water/hydrating fluids per day and exercise your lungs by walking/taking deep breaths frequently throughout  your day! Also, if you smoke please stop as smoking inhibits good wound healing and increases your risk of post surgical complications.      THINGS TO DO AFTER YOUR PROCEDURE    RESTRICTIONS:   You will have a dressing in place after surgery, do not remove the dressing. This dressing should stay on until the second surgery, if it begins to come off please contact the clinic and we can help instruct you on how to reinforce it with tape.    DO NOT get your dressing wet. You may sponge bath or use a manual shower head to wet the front of your body only, but avoid getting the dressing wet and do not take a bath or submerge in water.  No strenuous activity during the two week trial period. Walking, stairs, and equivalent activities are fine. Avoid bending at the hip as much as possible until after your second procedure. After your second procedure you may begin gentle bending but stop if you feel any pain in your back while doing this.      Drink at least 64 oz or more of hydrating fluids per day: Constipation is a common complication of surgery and to help prevent this ensure to drink lots of hydrating fluids per day making sure to replace ounce for ounce any caffeinated beverages as caffeine  is dehydrating.        Eat a well-balanced diet including fiber: You may resume your regular diet without restriction. Eating a well-balanced diet including fiber (unless otherwise restricted by your surgeon) will help promote healing and prevent constipation.    Walk throughout the day: This will help with healing, proper elimination of stool and urine, lung health, blood circulation and many other processes that occur within the body to get/keep you healthy.    Take medications as prescribed: . For pain it is best to take ibuprofen  and Tylenol  alternating so that you get something every few hours. It is good to take this scheduled for the first few days after surgery to stay ahead of the pain. Ibuprofen  600 to 800mg  can be taken  for a few days, but take this with food. Tylenol  1000 mg every 8 hours or 500 mg every 6 hours is okay. Follow the instructions on your bottles. Do no take more than 3000 mg of Tylenol  per day.      CALL THE CLINIC/ON-CALL SURGEON: If you have any questions or concerns after our procedure, please call [phone] Monday-Friday 8:00 am-5:00 PM. After hours or on weekends, please call the hospital operator at [phone] and ask for the colorectal surgeon on call.   What to watch for:  Please call using the numbers above if you have any of the following:    fever >101.5   Severe pain   Redness or drainage at the incision sites   Chest pain   Dizziness   Weakness   Fast pulse rate            **  Important Things To Know**      You Must Have A Driver    You Must Arrive 2 Hours Prior To Your Scheduled Start Time    You Must Not Smoke Within 2 Months Prior To Surgery    You Must Not Drink Alcohol  or Use Illicit Drugs Within 24 Hours Prior To Your Surgery    Do Not Shave In The Area of Surgery At Least 4 Days Prior to Surgery    If You Use a CPAP Machine and Will Be Staying Over Night You Must Bring Your CPAP With You      Due to complexities with surgery scheduling the time of your surgery will not be finalized until 24-48 hours prior to your procedure.  You will receive a call from the scheduling staff by 6 pm the day prior to your procedure (Friday for cases scheduled Monday).  If you have not been contacted by 6 pm the day prior to your procedure please call the scheduling office for the location your procedure has been scheduled (see Important Phone Numbers below).       IMPORTANT PHONE NUMBERS YOU MAY NEED    [facility] Operating Room Surgery Line:  If you need to cancel on the day of your scheduled surgery/procedure, please call [phone].       Colorectal Surgery Scheduler:  If you need to schedule or reschedule your surgery, or schedule or reschedule your post-op appointment, please call the  Colorectal Surgery Clinic at [phone] and ask for the Colorectal Surgery Scheduler.          OPERATING ROOM LOCATIONS   [address] Complex  [address] [place] [address]  Parking:   Parking Structure 2 - on [address] behind the [employer] [facility]).    Parking Structure 4 - on [address] next to [facility] Complex

## 2023-12-15 NOTE — Progress Notes (Signed)
 Preoperative instructions included in AVS to be reviewed in detail by patient.  PCQ sent, pt advised to complete.  Updated instructions sent via MyChart d/t GLP-1 use.  All questions asked and answered, pt endorses understanding.       Reggie Maryland, RN

## 2023-12-15 NOTE — Progress Notes (Unsigned)
 Colon [facility]al [facility] Clinic - Consult H&P   Attending: Dr. Alyce Greet (PI# [id number], Pager: [phone])   Date of Appointment: 12/15/2023 1:59 PM        CHIEF COMPLAINT / REASON FOR CONSULT     Pelvic Floor Dyssynergia and fecal incontinence    SUBJECTIVE     Jeanette Combs is a 89yr female presenting to the Colon and Rectal Surgery clinic today for Pelvic Floor Dyssynergia and fecal incontinence. She was referred to Dr. Greet by Dr. Darleene.    Patient has been having issues with fecal incontinence for the past 3 years. Per patient, in 1997 had a vaginal birth with 4th degree tears. Patient initially referred to gastroenterologist and has had extensive work up. Patient was then referred to colorectal surgeon in [facility]. Patient then had an anorectal manometry back in April 1st of 2025.    Patient has zero control of bowels at times with little to no warning. Patient has trialed physical therapy. She has participated in physical therapy for months once a week since last year and she has had more sessions this year as well. Last session was in May of this year. She notes minimal improvement.    Patient has had to change her lifestyle due to the incontinence. She has felt isolated and has had a lot of social anxiety. Patient most recent incontinence was this past Saturday. Patient describes multiple episodes in a week or it can be once a week. Stool can be formed or loose stools. Occurs with flatus as well. She does not wear a pad. Patient denies any history of IBD or issues with abdominal pain. Denies any diarrhea or blood in the stool currently. Denies any nausea or vomiting. Patient does hydrate, takes probiotics and other supplements.         COLORECTAL SURGICAL HISTORY     None      PAST MEDICAL HISTORY     Active Ambulatory Problems     Diagnosis Date Noted    No Active Ambulatory Problems     Resolved Ambulatory Problems     Diagnosis Date Noted    No Resolved Ambulatory Problems     Past Medical History:    Diagnosis Date    HTN (hypertension)     Metabolic syndrome     Nephrolithiasis     Renal cell carcinoma (HCC)     Right frontal osteoma     Sleep apnea         PAST SURGICAL HISTORY     Past Surgical History[1]  C-section  Nephrectomy for right renal cell carcinoma  Appendectomy  Hysterectomy with a bladder lift (November of 2023).      MEDICATIONS     Current Outpatient Medications on File Prior to Visit   Medication    Desvenlafaxine (PRISTIQ) 50 mg Sustained Release Tablet    Ergocalciferol, Vitamin D2, (VITAMIN D2) 50,000 unit Capsule    Lorazepam (ATIVAN) 1 mg Tablet    MAGNESIUM PO    Ondansetron (ZOFRAN-ODT) 4 mg disintegrating tablet    Potassium Citrate (UROCIT-K) 10 mEq SR Tablet    tirzepatide (MOUNJARO SC)    Tramadol (ULTRAM) 50 mg Tablet    Valsartan (DIOVAN) 40 mg Tablet     No current facility-administered medications on file prior to visit.        ALLERGIES     Allergies[2]     SOCIAL HISTORY     Social History[3]  Patient denies any tobacco use   Patient  denies any alcohol use  Patient denies any recreational drug use     FAMILY HISTORY     Family History[4]    No history of colorectal cancer.  No history of inflammatory bowel disease.     REVIEW OF SYMPTOMS     Review of Systems  Negative     PHYSICAL EXAM     Recent Vital Signs:  Temp: 36.8 C (98.3 F) (09/08 1334)  Temp src: Temporal (09/08 1334)  Pulse: 84 (09/08 1334)  BP: 108/73 (09/08 1334)  Resp: 16 (09/08 1334)  SpO2: --  Height: --  Weight: 80.9 kg (178 lb 5.6 oz) (09/08 1334)    Physical Exam  Constitutional:       General: She is not in acute distress.     Appearance: Normal appearance.   Abdominal:      General: Abdomen is flat. There is no distension.      Palpations: Abdomen is soft.      Tenderness: There is no abdominal tenderness.   Genitourinary:     Comments: Rectal exam deferred.  Musculoskeletal:         General: Normal range of motion.   Neurological:      Mental Status: She is alert.          RECENT IMAGING           RECENT LABS     No results found for: WBC, HGB, PLT, CEA, NA, K, CR, ALB       ASSESSMENT AND PLAN     [name] is a 2yr female presenting for not improving pelvic floor dyssynergia and chronic fecal incontinence. Patient has trialed physical therapy with minimal improvement. Patient is a good candidate for sacral nerve stimulation.  Plan  - Case request placed for sacral nerve stimulation  - Scheduler will reach out to patient to schedule date     The patient was seen and examined with Dr. Darrel, Colon and Rectal Surgery attending of record, with whom the above assessment and plan were jointly formulated.     Electronically signed by: Emil Shad, Med Student - Medical Student   12/15/2023 14:48           [1]   Past Surgical History:  Procedure Laterality Date    CESAREAN DELIVERY ONLY      CHOLECYSTECTOMY, LAPAROSCOPIC      COLONOSCOPY  2024    normal    EGD  2023    normal    EXTRACORPOREAL SHOCK WAVE LITHOTRIPSY (ESWL)      HYSTERECTOMY      ovaries remain    LAP UMBILICAL HERNIA REPAIR      MANOMETRY, ANORECTAL  07/08/2023    NEPHRECTOMY      OTHER SURGICAL HISTORY      bladder lift    REPAIR, HIATAL HERNIA     [2]   Allergies  Allergen Reactions    Bactrim [Sulfamethoxazole-Trimethoprim] Hives    Levaquin [Levofloxacin] Unknown-Explain in Comments     Patient states happened a long time ago.    [3]   Social History  Tobacco Use    Smoking status: Never    Smokeless tobacco: Never   Substance Use Topics    Alcohol use: Not Currently    Drug use: Never   [4]   Family History  Problem Relation Name Age of Onset    Breast Cancer Maternal Grandmother

## 2023-12-19 NOTE — Progress Notes (Signed)
 Patient seen and examined with [facility], [facility]. Agree with the above history, physical, assessment, and plan, which we formulated together.     50F presenting with fecal incontinence. Has hx of episiotomy. Causing significant impact of day to day life. Happens multiple times per week. Has failed medical therapy and PT. She is interested in trial of sacral nerve stimulation, which we dicussed and for which I think she is an excellent candidate. Discussed the risks of bleeding, infection, failure to resolve symptoms, need for device explant, nerve injury. She understands and wishes to proceed.    - Plan for SNS trial    I was physically present with the medical student during the examination of the patient.  I personally examined this patient and developed the assessment and plan.  I verified the student's documentation and made changes as appropriate.  The documentation accurately reflects my findings of the patient's history and exam, and my assessment and plan.    Total time I spent in care of this patient today (excluding time spent on other billable services) was 45 minutes.    Alyce Greet, MD  Assistant Professor of Surgery  Colorectal Surgery

## 2024-01-01 ENCOUNTER — Telehealth: Payer: Self-pay

## 2024-01-01 NOTE — Telephone Encounter (Signed)
Called patient to give pap smear results. Informed patient that pap smear was ASCUS and HPV was negative. Based on this result and her previous hx her next pap smear will be due in 1 year. Patient voiced understanding.

## 2024-01-06 ENCOUNTER — Telehealth: Payer: Self-pay

## 2024-01-06 NOTE — Telephone Encounter (Signed)
 Telephone call to patient--verified name, DOB, MRN and/or address at initiation of call.    Pt confirmed OR dates 11/26 and 03-30-2024. Mark notified for CPT codes and financial clearance. Pre/post appts set.    Nuala Cobble, RN

## 2024-02-24 ENCOUNTER — Ambulatory Visit

## 2024-02-24 DIAGNOSIS — Z01818 Encounter for other preprocedural examination: Secondary | ICD-10-CM

## 2024-02-24 NOTE — Progress Notes (Signed)
 Telephone call and patient identified by name, birth date, and phone or address.  Pre-op instructions reviewed for up coming procedure.  All questions answered.  Patient verbalized understanding and agreed with the plan of care.      Gilles Lacks, RN

## 2024-03-01 NOTE — Pre-Op/Pre-Procedure Screening (Addendum)
 [facility] Preoperative Phone Call Documentation [facility] -  DOS 03/03/2024    Procedure(s):  INSERTION, NEUROSTIMULATOR, SACRAL (N/A)  Surgeon(s):  Darrel Mallick, MD      Call to provide Pre-op instructions. Spoke with patient. ID x 3.  Successfully provided Pre-op instructions and sent via MyChart, if patient has active MyChart account.    Patient had an allergic reaction to nasal cannula.  Please only use facemask.        Infectious Disease Screen:    1. In the past 2 weeks, has the patient had new cold/influenza-like symptoms not related to a chronic illness such as: Fever >100 F(37.8 C), chills, muscle pain, headache, abdominal pain, diarrhea, nausea, vomiting, loss of taste/smell, cough, shortness of breath, runny nose, nasal/chest congestion, rash, or sore throat?     NO    2. In the past 3 weeks, has the patient been exposed to anyone with measles or cold symptoms AND rash?     NO      ADULTS ONLY: Do you/does the patient have any special needs we should be aware of?  Adults: Yes (place in Special Needs)      Location and Check-in for Day of Surgery/Procedure    [facility] location: [phone] Complex - Address: [address] [place], [place] [address].    Verification of planned surgery times  Surgery Date/Time Verified: Yes (03/03/24, 0730)  Arrival Time Verified: 0530  Surgery Check-In Location Verified: [facility] OR    Patient parking is located in Parking Structure #4. The address is [address]. Designated patient parking is located on Level 1 in the [employer]. On the day of surgery, we will validate one parking ticket per patient per day with no in-and-outs, free of charge.      Post surgical discharge information   Person to pick up pt (if different than support person): Will- friends  Phone number of person picking up patient (do not include dashes): [phone]    Please enter through the Main Entrance of the building and continue to the Main Lobby to begin the check-in process.       Once you enter the Main Lobby, you will see the Guest Relations desk and Admissions off to your right-hand side.    Here are your instructions for the check-in process:    For patients who are instructed to arrive at 5:30 AM or later (or 6:30 AM or later on Tuesdays): Stop at Guest Relations then proceed to Admissions for check-in. The Admissions staff will then direct you up to the 2nd floor to [address]: Pre-Procedure/PACU. Here, you will check-in with our Patient Navigator Team and our Pre-op staff will be updated regarding your arrival.      Please note: As [facility] is a trauma center, the time of surgery is subject to change.  If a change is necessary- someone will contact you as soon as possible with new instructions for the day of surgery. Please make arrangements to be flexible for the day of surgery.  If you are unable to accommodate the necessary change- please notify the surgical clinic or surgery scheduler.      Diet Instructions    Fasting Instructions:  Time of Last Clear Liquids:   Date of Last Clear Liquids: follow surgery clinic's instructions   Time of Last Full Liquids:   Date of Last Full Liquids:  follow surgery clinic's instructions   Time of Last Solids :   Date of Last  Solids: follow surgery clinic's instructions     If you have received special diet instructions from your Surgeon (for example, bowel prep) Please Follow Those Instructions.  If you have questions regarding those instructions- please reach out to your Surgical Clinic     Fasting Guidelines for Adults Taking GLP-1 Agonists (Ozempic, Wegovy, Trulicity, etc.)    Some medications for weight loss or diabetes require that you to take a very specific diet before surgery. We have identified that you are taking one of these medications. It is important that you follow these instructions carefully to prevent medical complications.    Unless instructed otherwise by a member of your care team, you may eat a regular diet until  instructed to STOP solids and start FULL liquids the day before your surgery. If you have special dietary needs that won't let you follow this diet, please reach out to the medical professional who manages that diet.    If you have received special bowel prep diet instructions, please follow those instead.        Do not eat solid foods the day before your procedure. Do not chew gum, eat mints or candies, or use any oral nicotine products (chewing tobacco, nicotine packets) while you are unable to eat solids.    These are FULL liquids. FULL liquids and CLEAR liquids are the only things you may have the day before your surgery. NO SOLIDS.  Thin soup without chunks of meat or vegetable. Pureed soups (like tomato) or creamy soups are acceptable.  Milk or milk-based liquids (cream, milk shakes, ice cream without chunky ingredients)  Plant-based milk  Yogurt  Protein drinks (Ensure)  Pudding  Jell-o (gelatin-based only, no chunks or fruit)  Carbonated Sodas  Liquids with added thickeners to aid with swallowing.      These are CLEAR liquids. CLEAR liquids are the only things you may have starting at 10pm or midnight (see times above) and ending 2 hours before surgery. NO SOLIDS OR FULL LIQUIDS.  Water and flavored water  Yellow/[place]/green sports drinks  Ensure Clear (only the version labeled Clear is allowed)  Filtered Apple or White Grape juice (NO PULP OR CHUNKS). Please do not consume red juices.  Black Coffee or Black Tea without any cream additives (no milk, plant milk, or creamer).  Thickened liquids (to aid with swallowing) are considered FULL liquids, not clear.  DO NOT DRINK alcohol containing beverages.    Starting 2 hours before your procedure, you should not take ANYTHING by mouth. No solids, No liquids.        Allergies[1]    Tobacco Use History[2]  Social History     Substance and Sexual Activity   Alcohol Use Not Currently     Social History     Substance and Sexual Activity   Drug Use Never         *If  Patient drinks alcohol, they were advised not to drink alcohol on DOS.  *If Patient is a current smoker (Tobacco), they were advised not to smoke on DOS.  *If Patient uses Recreational Marijuana, they were advised not to use marijuana products for at least 3 days prior to surgery.   *If Patient uses Medical Marijuana (e.g. epilepsy, nausea), they were advised not to smoke on the day of surgery.    Patient Medication Instructions:    Stop NSAIDs 7 days prior to surgery (ibuprofen, motrin, naproxen, aleve, mobic, meloxicam, diclofenac, etc) or as directed by your surgeon.    Acetaminophen   or Tylenol  is OK to keep taking.    Stop Vitamins and Supplements for 7 days prior to surgery (if you are taking prescribed vitamins and supplements consult the Prescribing Physician).     Aspirin and Blood thinner medication as directed by your Surgeon and Prescribing Physician (if you are unsure, call your surgeon).    Please bring your rescue inhaler on the day of surgery if you have one.     Current Medications   Medication Sig Instructions    Desvenlafaxine (PRISTIQ) 50 mg Sustained Release Tablet Take 1 tablet by mouth. Continue as prescribed    Ergocalciferol, Vitamin D2, (VITAMIN D2) 50,000 unit Capsule Take 1 capsule by mouth one time each week. Mondays Continue as prescribed    Lorazepam (ATIVAN) 1 mg Tablet Take 1 tablet by mouth. Continue as prescribed    MAGNESIUM PO Take by mouth. Stop 7 days prior to surgery    rosuvastatin (Crestor) 5 mg tablet Take 1 tablet by mouth every day at bedtime. Continue as prescribed    tirzepatide (MOUNJARO SC) Inject 10 mg subcutaneously. (Patient taking differently: Inject 15 mg subcutaneously. Takes on Sundays) Do not take on day of surgery    Valsartan (DIOVAN) 40 mg Tablet Take 1 tablet by mouth. Do not take on day of surgery             Instructions for Day of Surgery    Do not smoke or drink alcohol.  Bring a building services engineer ID and medical insurance card. (Pediatric patients do not need to  present an ID).  Bathe or shower on the morning of surgery. After bathing, be sure that personal care products are NOT applied to the skin; such as: cosmetics, deodorant, ointments, creams, lotions, powders or scented products.   Wear comfortable clothing that is easy to take on and off.   Leave all jewelry at home and be sure to remove all piercings, if applicable.  Pediatric patients: Bring any comfort/entertainment items as needed, for which you will be responsible.  Do not bring any valuables.   Do not bring any medications, unless instructed otherwise.   [facility] pharmacy is cashless, therefore, in the event the provider prescribes discharge medications, be prepared by bringing a credit card for payment.        Bring your CPAP/BiPAP machine, if applicable.   If you are bringing any medical devices (CPAP, motorized wheelchair), to the hospital that require charging, please remember to bring the charger(s) with you. Please ensure whoever is accompanying the patient can monitor and maintain the equipment.  If you are discharged home the same day as surgery and do not require a 23-hr stay for observation, you must arrange for a responsible adult to transport you home and stay with you for 24 hours. Surgery will be cancelled if you are unable to accommodate this.       General information     Guest Relations is open from 5:00 AM-6:00 PM, Monday-Friday.   If needed, wheelchairs are available for personal use in the Main Lobby to help transport patients up to the surgery floor.   The [facility] pharmacy is located on the 1st floor, [address], and is open Monday-Friday from 8:00 AM-7:00 PM.   The surgery waiting room is equipped with self-service lockers for patient belongings. Family/friends are also encouraged to hold on to your belongings while you are in surgery.    Only those patients who are experiencing any cold, flu or Covid-like symptoms will need  to complete a home Covid test and report the result back to us  at  [phone].     Visitor information:   All Patients: You may have 2 consistent visitors over the age of 54 accompany you to the waiting room. All visitors of patients will need to present an ID in order to obtain a visitor's pass from Guest Relations (if they do not have an ID, we can create a visitor's pass without it as well). Visitors will not be allowed if they have any cold/flu/Covid symptoms. Visiting hours in the [facility] Complex: 9:00 AM-9:00 PM.   Pediatric Patients: There must be at least one legal guardian present who can consent for the patient.        Please call [phone] if you develop any new upper respiratory symptoms and/or rash, if there are changes to the medications you take, or if you have any questions about your arrival instructions. If you have questions pertaining specifically to your surgery/procedure or regarding post-operative plans or care, please contact your surgeon's clinic directly.        Sharlet Plaster, RN  Pre-Anesthesia Clinic / Pre-Op Calls          [1]   Allergies  Allergen Reactions    Bactrim [Sulfamethoxazole-Trimethoprim] Hives    Levaquin [Levofloxacin] Unknown-Explain in Comments     Patient states happened a long time ago.     Miscellaneous Medical Supply Other-Reaction in Comments     Reaction to nasal cannula (started as runny nose that lead to nasal infection)- prefers to wear face mask only.    [2]   Social History  Tobacco Use   Smoking Status Never   Smokeless Tobacco Never

## 2024-03-03 ENCOUNTER — Encounter: Admission: RE | Payer: Self-pay | Source: Ambulatory Visit

## 2024-03-03 ENCOUNTER — Other Ambulatory Visit

## 2024-03-03 ENCOUNTER — Ambulatory Visit: Admission: RE | Admit: 2024-03-03 | Discharge: 2024-03-03 | Source: Ambulatory Visit

## 2024-03-03 ENCOUNTER — Ambulatory Visit: Payer: Self-pay | Admitting: ANESTHESIOLOGISTS

## 2024-03-03 DIAGNOSIS — R159 Full incontinence of feces: Secondary | ICD-10-CM | POA: Insufficient documentation

## 2024-03-03 SURGERY — INSERTION, NEUROSTIMULATOR, SACRAL
Anesthesia: Monitored Anesthesia Care | Site: Back | Wound class: Clean

## 2024-03-03 MED ORDER — INTRAOP DEXMEDETOMIDINE 100 MCG/ML IV BOLUS DOSE
Status: DC | PRN
Start: 2024-03-03 — End: 2024-03-03
  Administered 2024-03-03 (×2): 10 ug via INTRAVENOUS

## 2024-03-03 MED ORDER — MIDAZOLAM (PF) 1 MG/ML INJECTION SOLUTION
INTRAMUSCULAR | Status: DC | PRN
Start: 2024-03-03 — End: 2024-03-03
  Administered 2024-03-03: 2 mg via INTRAVENOUS

## 2024-03-03 MED ORDER — LIDOCAINE HCL 10 MG/ML (1 %) INJECTION SOLUTION
0.1000 mL | INTRAMUSCULAR | Status: DC | PRN
Start: 2024-03-03 — End: 2024-03-03

## 2024-03-03 MED ORDER — INTRAOP GLYCOPYRROLATE 0.2 MG/ML 1 ML VIAL
Status: DC | PRN
Start: 2024-03-03 — End: 2024-03-03
  Administered 2024-03-03: .2 mg via INTRAVENOUS

## 2024-03-03 MED ORDER — INTRAOP LIDOCAINE PF 2% INJ 5 ML VIAL
Status: DC | PRN
Start: 2024-03-03 — End: 2024-03-03
  Administered 2024-03-03: 50 mg via INTRAVENOUS

## 2024-03-03 MED ORDER — PROCHLORPERAZINE EDISYLATE 10 MG/2 ML (5 MG/ML) INJECTION SOLUTION
5.0000 mg | INTRAMUSCULAR | Status: DC | PRN
Start: 2024-03-03 — End: 2024-03-03

## 2024-03-03 MED ORDER — INTRAOP BUPIVACAINE PF 0.25% INJ 30 ML VIAL ADDITIVE
Status: DC | PRN
Start: 2024-03-03 — End: 2024-03-03

## 2024-03-03 MED ORDER — BUPIVACAINE (PF) 0.25 % (2.5 MG/ML) INJECTION SOLUTION
INTRAMUSCULAR | Status: AC
Start: 2024-03-03 — End: 2024-03-03
  Filled 2024-03-03: qty 20

## 2024-03-03 MED ORDER — INTRAOP PROPOFOL INJ 100 ML VIAL (BOLUS+INFUSION)
Status: DC | PRN
Start: 2024-03-03 — End: 2024-03-03
  Administered 2024-03-03: 20 mg via INTRAVENOUS
  Administered 2024-03-03: 150 ug/kg/min via INTRAVENOUS
  Administered 2024-03-03: 40 mg via INTRAVENOUS

## 2024-03-03 MED ORDER — LIDOCAINE 1 %-EPINEPHRINE 1:100,000 INJECTION SOLUTION
INTRAMUSCULAR | Status: AC
Start: 2024-03-03 — End: 2024-03-03
  Filled 2024-03-03: qty 20

## 2024-03-03 MED ORDER — LACTATED RINGERS IV INFUSION
INTRAVENOUS | Status: DC
Start: 2024-03-03 — End: 2024-03-03

## 2024-03-03 MED ORDER — ACETAMINOPHEN 500 MG TABLET
1000.0000 mg | ORAL_TABLET | ORAL | Status: AC
Start: 2024-03-03 — End: 2024-03-03
  Administered 2024-03-03: 1000 mg via ORAL
  Filled 2024-03-03: qty 2

## 2024-03-03 MED ORDER — LIDOCAINE-EPINEPHRINE (PF) 1 %-1:200,000 INJECTION SOLUTION
INTRAMUSCULAR | Status: AC
Start: 2024-03-03 — End: 2024-03-03
  Filled 2024-03-03: qty 30

## 2024-03-03 MED ORDER — FENTANYL (PF) 50 MCG/ML INJECTION SOLUTION
INTRAMUSCULAR | Status: AC
Start: 2024-03-03 — End: 2024-03-03
  Filled 2024-03-03: qty 2

## 2024-03-03 MED ORDER — INTRAOP NACL 0.9% 1000 ML IRRIGATION BOTTLE
Status: DC | PRN
Start: 2024-03-03 — End: 2024-03-03
  Administered 2024-03-03: 1000 mL

## 2024-03-03 MED ORDER — ONDANSETRON HCL (PF) 4 MG/2 ML INJECTION SOLUTION
4.0000 mg | INTRAMUSCULAR | Status: DC | PRN
Start: 2024-03-03 — End: 2024-03-03

## 2024-03-03 MED ORDER — EPHEDRINE SULFATE 5 MG/ML INTRAVENOUS SOLUTION
INTRAVENOUS | Status: DC | PRN
Start: 2024-03-03 — End: 2024-03-03
  Administered 2024-03-03: 10 mg via INTRAVENOUS

## 2024-03-03 MED ORDER — HYDROMORPHONE 1 MG/ML INJECTION SYRINGE
0.2000 mg | INJECTION | INTRAMUSCULAR | Status: DC | PRN
Start: 2024-03-03 — End: 2024-03-03

## 2024-03-03 MED ORDER — FENTANYL (PF) 50 MCG/ML INJECTION SOLUTION
25.0000 ug | INTRAMUSCULAR | Status: DC | PRN
Start: 2024-03-03 — End: 2024-03-03

## 2024-03-03 MED ORDER — MIDAZOLAM 1 MG/ML INJECTION SOLUTION
INTRAMUSCULAR | Status: AC
Start: 2024-03-03 — End: 2024-03-03
  Filled 2024-03-03: qty 2

## 2024-03-03 MED ORDER — CEFAZOLIN 2 GRAM SOLUTION FOR INJECTION
2.0000 g | INTRAMUSCULAR | Status: AC
Start: 2024-03-03 — End: 2024-03-03
  Administered 2024-03-03: 2 g via INTRAVENOUS

## 2024-03-03 MED ORDER — LIDOCAINE-EPINEPHRINE (PF) 1 %-1:200,000 INJECTION SOLUTION
INTRAMUSCULAR | Status: DC | PRN
Start: 2024-03-03 — End: 2024-03-03
  Administered 2024-03-03: 47 mL

## 2024-03-03 MED ORDER — PHENYLEPHRINE 1 MG/10 ML (100 MCG/ML) IN 0.9 % SOD.CHLORIDE IV SYRINGE
INJECTION | INTRAVENOUS | Status: DC | PRN
Start: 2024-03-03 — End: 2024-03-03
  Administered 2024-03-03: 100 ug via INTRAVENOUS

## 2024-03-03 MED ORDER — INTRAOP FENTANYL 50 MCG/ML INJ 2 ML AMP
Status: DC | PRN
Start: 2024-03-03 — End: 2024-03-03
  Administered 2024-03-03: 25 ug via INTRAVENOUS
  Administered 2024-03-03: 50 ug via INTRAVENOUS
  Administered 2024-03-03: 25 ug via INTRAVENOUS

## 2024-03-03 MED ORDER — PROPOFOL 10 MG/ML INTRAVENOUS EMULSION
INTRAVENOUS | Status: AC
Start: 2024-03-03 — End: 2024-03-03
  Filled 2024-03-03: qty 100

## 2024-03-03 SURGICAL SUPPLY — 33 items
AXONICS LEAD EXTENSION PERCUTANEOUS 9009 (Lead) IMPLANT
AXONICS LEAD TINED 1201 (Lead) IMPLANT
BLADE KNIFE 11 (Blade) ×1 IMPLANT
CABLE GROUND STIMULATION 9003 (Other) IMPLANT
CAUTERY BOVIE PAD ADULT (Cautery) ×1 IMPLANT
COVER LIGHT HANDLE STERIS LB83 (Other) ×3 IMPLANT
DRAPE LAP CHOLESTECTOMY (Drape) ×1 IMPLANT
DRAPE SHEET ORTHO BAR 72 X 112IN (Drape) ×1 IMPLANT
DRAPE STERI IOBAN 3M 6650 (Drape) ×1 IMPLANT
DRAPE XRAY C-ARM COVER 4951 (Drape) ×1 IMPLANT
DRESSING DERMABOND ADVANCED TISSUE ADHESIVE (Dressing) ×1 IMPLANT
DRESSING STERISTRIPS 1/2IN (Dressing) IMPLANT
DRESSING TEGADERM 4 X 4.5IN MEDIUM (Dressing) ×2 IMPLANT
DRESSING TEGADERM 4IN X 4.75FT LATEX FREE (Dressing) ×2 IMPLANT
DRESSING TEGADERM 8 X 12IN LARGE (Dressing) ×1 IMPLANT
ELECTRODE BLADE INSULATED 2.75IN (Cautery) ×1 IMPLANT
GLOVES BIOGEL 7 1/2 TOP GLOVE LATEX (Glove) ×1 IMPLANT
GLOVES BIOGEL INDICATOR 8 GREEN UNDERGLOVE LATEX (Glove) ×1 IMPLANT
GOWN LARGE AERO BLUE AAMI 3 STANDARD (Gown) ×1 IMPLANT
LEAD IMPLANT 1801 (Other) IMPLANT
NEEDLE 22 GAUGE 1-1/2 IN REGULAR BEVEL (Needle) ×1 IMPLANT
NEEDLE 25 GAUGE X 1 1/2IN SAFETY HYPODERMIC (Needle) ×2 IMPLANT
PACK BASIN LATEX SAFE DYNJ0191310U (Pack) ×1 IMPLANT
PILLOW DISPOSABLE 21 X 27 IN (Pillow) ×2 IMPLANT
POSITIONER ARM CRADLE (Disp Inst) ×1 IMPLANT
REMOTE CONTROL 2301 (Device) IMPLANT
SPONGE GAUZE 4 X 4IN 12PLY STERILE 10 PACK (Dressing) ×1 IMPLANT
SPONGE LAPAROTOMY 4X4IN 16 PLY RADIOPAQUE SURGICOUNT GAUZE STERILE LATEX FREE DISPOSABLE (Sponge) ×1 IMPLANT
SPONGE LAPAROTOMY RADIOPAQUE SURGICOUNT SAFE-T-LAP STERILE LATEX DISPOSABLE 4X18IN (Sponge) ×1 IMPLANT
STIMULATOR TRIAL 1601 (Other) IMPLANT
SUTURE MONOCRYL PLUS 4-0 PS-2 27IN MONOFILAMENT ANTIBACTERIAL UNDYED ABSORBABLE (Suture) ×1 IMPLANT
SUTURE VICRYL 3-0 SH VCP416 (Suture) ×1 IMPLANT
SYRINGE 10ML LUER LOCK (Syringe) ×1 IMPLANT

## 2024-03-03 NOTE — Nurse Assessment (Signed)
 PREOP ADMIT NURSING NOTE    Note Started: 03/03/2024, 05:42     Received patient from as a direct admit at 0540 hours via ambulation.  Patient status  awake and alert.  Oriented to room and unit.  Admission assessment and care plan initiated.   PIN number cards provided to patient.   Sinclair Marquita Fey, RN

## 2024-03-03 NOTE — Nurse Discharge Note (Signed)
 Discharge to home via wheelchair to private auto at [facility].   Belongings with patient.  See EMR for assessment.  Franky Laster, RN

## 2024-03-03 NOTE — Discharge Instructions (Addendum)
 [facility]  Alyce Greet, MD    DISCHARGE INSTRUCTIONS FOR Sacral nerve stimulator implant    You may resume your regular diet without restriction  Do not remove the dressing in place. This dressing should stay on until the second surgery. Please let us  know if it is coming off and we can help instruct you on how to reinforce it with tape  Try and avoid getting the dresing directly wet. Do not soak in a bath or in water. You may sponge bath or you can use a manual shower head to wet the front of your body but avoid getting the dressing wet and do not take a bath or submerge in water.   No strenuous activity during the trail period. Walking, stairs, and equivalent activities are fine. Avoid bending at the hip as much as possible until 4 weeks after surgery. After 4 weeks you may begin gentle bending but stop if you feel any pain in your back while doing this.  For pain it is best to take ibuprofen and tylenol  alternating so that you get something every few hours. It is good to take this scheduled for the first few days after surgery to stay ahead of the pain. Ibuprofen 600 to 800mg  can be taken for a few days, but take this with food. Tylenol  1000 mg every 8 hours or 500 mg every 6 hours is okay. Follow the instructions on your bottles. Do no take more than 3000 mg of tylenol  per day  Special medication instructions: None    Contact us :  If you have any questions or concerns after our procedure, please call [phone] Monday-Friday 8:00 am-5:00 PM. After hours or on weekends, please call the hospital operator at [phone] and ask for the colorectal surgeon on call    What to watch for:  Please call using the numbers above if you have any of the following:  fever >101.5   Severe pain   Redness or drainage at the incision sites  Chest pain  Dizziness  Weakness   Fast pulse rate     Adult Acetaminophen  Instruction      During your surgery you received Acetaminophen  (Tylenol ). This was given to help  your pain.      For the next 24 hours do not take more than 3000mg  of Acetaminophen . After the first 24 hours, you may take up to 4000mg  total per day. If a doctor has ever told you to take less acetaminophen  in a 24-hour period, please follow their original recommended amount.     Many medications contain Acetaminophen . Please check the amount of Acetaminophen  in all prescribed and over-the-counter medications. Make sure you do not go over the 24-hour limit.     Do not take additional Acetaminophen  products while taking the prescribed pain medication.      Some of these medications may include: Tylenol , Arthritis Pain Relief, Excedrin, Nyquil, Dayquil, Alka-Seltzer Plus, Robitussin, Sudafed, Dimetapp     Please read ingredients in all medications.     Please wait 6 hours before taking any medication containing acetaminophen  (Tylenol ). Next dose can be taken after 1215 pm

## 2024-03-03 NOTE — Nurse Focus (Signed)
 PACU ADMIT NURSING NOTE    Note Started: 03/03/2024, 09:11     Received patient from OR at 0905 hours via gurney.  Monitor and Alarms on.  Patient sleepy but arousable. Franky Laster, RN

## 2024-03-03 NOTE — Anesthesia Preprocedure Evaluation (Addendum)
 Anesthesia Evaluation      Review of systems    Review of System includes input from Chart Review  HPI:53 f p/f Insertion sacral neurostim      Anesthesia History    History of postoperative nausea and vomiting.         Pulmonary      Sleep apnea   Cardiovascular      Hypertension  Controlled       Neuro/Psych     History of Smoking         GI/HEPATIC/RENAL     ENDO/Other      No diabetes         Hematology     Oncology    Cancer  Renal               Physical Exam    Airway:    Mallampati score II  Thyromental distance >3 FB  Neck range of motion: full       Cardiovascular:    Rhythm regular  Rate normal       Dental:    no notable dental history       Pulmonary:    Clear to auscultation       Abdominal:                 Anesthesia Plan    ASA 2     MAC     Intravenous induction    Postoperative administration of opioids is intended.  Trial extubation is planned.      Anesthetic plan, perioperative analgesia plan, risks and benefits were discussed with patient. I confirmed the pre-anesthetic exam and prescribed the anesthesia plan.  I reviewed the available note and agree with the documentation.

## 2024-03-03 NOTE — Op Note (Signed)
 COLON & RECTAL SURGERY   Operative Note  Attending: Darrel Mallick, MD        Date of Surgery: 03/03/2024  Patient Name: Jeanette Combs  Date of Birth: [birth year]        Diagnosis and Procedure       Preoperative Diagnosis:  Full incontinence of feces [R15.9]  Postoperative Diagnosis:  Same    Procedure(s):  INSERTION, NEUROSTIMULATOR, SACRAL (N/A)    Surgeons and Role:     DEWAINE Darrel Mallick, MD - Primary     * Connor Mends, MD - Resident - Assisting    Type of Anesthesia:  General     Wound Classification: Clean        Procedure Data        Findings:  Successful implant of lead into left S3 foramina  Complications:  None apparent  EBL:  <5 mL  Fluids:  500 mL  UOP: No foley  Specimens Removed:  None  Outcome:  PACU in stable condition  Venous thrombosis prophylaxis: Not indicated      Procedure Description     Indications:   This is a 81yr year old woman who presented to clinic with complaint of fecal incontinence. We discussed treatment options and she elected to proceed with trial of sacral nerve stimulator. Therefore, we are proceeding to the OR today for 1st stage sacral nerve stimulator insertion.    Procedure in detail  I met the patient in the preoperative holding area. We discussed the operative plan for today, including the indications, risks, benefits, and alternatives. Patient was in agreement with proceeding to the OR as planned.     Patient was brought back to the operating room and monitored anesthesia sedation was begun. Patient was positioned in prone position with arms extended, with care to pad all pressure points. The lower back and perineum was prepped and draped in the usual sterile fashion.     A time out was performed amongst all key team members to confirm the correct patient and procedure. Procedure was begun with infiltration of 1:1 mix of 0.25% Marcaine  and 1% lidocaine  with epinephrine . A total of 47 mL was used throughout the case. The sacrum and foramina were marked out with the assistance of  fluoroscopy. Needle was successfully placed into left S3 foramina. Response in the left S3 foramina was excellent. The lead was placed here and tested with appropriate bellows and toe flexion in all four leads. The lead was implanted under fluoroscopy. All stimulations were <2 mA. The sacral wing was then identified and a 4 cm pocket created here on the right side. The lead was tunneled to this side and then attached to the extender and tunneled superiorly and secured. Good hemostasis was noted. All incisions were closed with 4-0 Monocryl with Dermabond over top.  A sterile dressing was placed over the lead and generator. Patient was awoken from her sedation and brought to the PACU in stable condition,     All instruments, needles and sponge counts were correct at the end of the case.   I was personally present for the entire procedure and performed all critical aspects of the case myself.    The information contained on this form is true and accurate to the best of my knowledge.  Further, I understand that if I misrepresent, falsify or conceal information regarding my participation in the professional service described above, I may be subject to fine, imprisonment, or civil penalty under applicable federal laws.  Potential Modifier: None   Are there any other factors about the case that are important for correct coding?  no    Comorbidity/Risk Capture:     Wound Classification: Clean  Case Type:  Elective  Was infection present at time of surgery? Infection was NOT present at time of operation  Closing Tray Protocol (Policy OR.PC.10) Followed? Not Applicable    Alyce Greet, MD  Assistant Professor of Surgery  Colorectal Surgery

## 2024-03-03 NOTE — H&P (Signed)
 Colon and Rectal Surgery - Pre-Op H&P   Attending: Dr. Alyce Greet           Date of Surgery: 03/03/2024  Resident: Junior Lefevre [phone])    Pre-Operative Interval History and Physical Exam     Jeanette Combs was seen and evaluated pre-operatively. Please see the last charted History & Physical dated 12/19/2023 for further detailed information. In the interval since the prior H&P, there have been no significant changes or new findings.     Jeanette Combs is a 53yr-old female scheduled for Sacral nerve stimulator insertion (trial). She has a history of fecal incontinence and a 4th degree tear from a vaginal delivery in 1997. she reports feeling well and denies any changes to her health or the addition of new medications since the last visit to our clinic.    She denies fever, chills, nausea, vomiting, chest pain, shortness of breath, or other symptoms of recent illness. She denies any recent ED visits, medications changes, or hospitalizations.    Vital Signs     Temp: 36.3 C (97.3 F) (11/26 0544)  Temp src: Temporal (11/26 0544)  Pulse: 67 (11/26 0544)  BP: 99/71 (11/26 0544)  Resp: 16 (09/08 1334)  SpO2: 97 % (11/26 0544)  Height: 172.7 cm (5' 8) (11/26 0544)  Weight: 83.4 kg (183 lb 14.4 oz) (11/26 0544)    Physical Exam     General: No acute distress. Comfortable.  Neuro: Alert, oriented, no focal deficits.  Pulm: Breathing comfortably on room air.  Abd: Soft, non-distended, non-tender.   Ext: Moves all extremities.    Recent Labs and Studies     No results found for: WBC, HGB, PLT, CEA, NA, K, CR, ALB    Pre-Operative H&P Summary     The patient was given the opportunity to ask questions.  The patient's recent labs and study results were reviewed.  The patient's pre-operative orders have been entered.  The patient was consented for surgery, consent was reviewed.  The patient was signed/marked and is ready for surgery.    Anticipated Post-Op Disposition: Home      Electronically Signed By:      Junior Lefevre  Colorectal Surgery Service  CRS Pager [phone]  PGY1 General Surgery  PI#: [id number]

## 2024-03-03 NOTE — Anesthesia Postprocedure Evaluation (Signed)
 Patient: Jeanette Combs    INSERTION, NEUROSTIMULATOR, SACRAL    Anesthesia Type: MAC    Stop Bang: 4      Vitals Value Taken Time   BP 107/65 03/03/24 09:45   Pulse 75 03/03/24 09:46   Resp 19 03/03/24 09:46   Temp 36 C (96.8 F) 03/03/24 09:30   SpO2 97 % 03/03/24 09:46   Oxygen Concentration (%)     Device (Oxygen Therapy) room air 03/03/24 09:30   (0-10) Pain Goal/Acceptable Level     (0-10) Pain Rating: Rest 0 03/03/24 09:30   Vitals shown include unfiled device data.    Anesthesia Post Evaluation    Procedure: Procedure(s):  INSERTION, NEUROSTIMULATOR, SACRAL  Location: [facility] OR  Anesthesia: General    Patient location during evaluation: PACU  Patient participation: complete - patient participated  Level of consciousness: awake and alert  Pain score: 1  Pain management: adequate    Airway patency: patent    Cardiovascular status: blood pressure returned to baseline  Respiratory status: room air  Hydration status: euvolemic  Anesthetic complications: no  Nausea absent in Recovery               Patient meets requirements for PACU RN Signout  Activity: voluntary movement of all four extremities or similar to baseline (preoperative assessment).  Respiration: 12-20 breaths per minute.  Heart rate: 60 to 100 beats per minute.  Blood pressure: systolic BP is between 100-160 mmHg or within 20% of the preoperative level for two consecutive BP (15 minutes apart).  Oxygen saturation: >92% on baseline oxygen requirement.  Mental status/consciousness: accurately answer orientation questions or unchanged from preoperative status.  Pain: tolerable/acceptable to the patient or minimal discomfort.  Nausea: none/mild nausea with no active vomiting.  Surgical bleeding: minimal/no dressing change or consistent with procedure.        Ozell Burnet, MD

## 2024-03-08 ENCOUNTER — Ambulatory Visit

## 2024-03-08 DIAGNOSIS — R159 Full incontinence of feces: Secondary | ICD-10-CM

## 2024-03-08 DIAGNOSIS — M6289 Other specified disorders of muscle: Secondary | ICD-10-CM

## 2024-03-08 DIAGNOSIS — Z48811 Encounter for surgical aftercare following surgery on the nervous system: Secondary | ICD-10-CM

## 2024-03-08 DIAGNOSIS — Z9682 Presence of neurostimulator: Secondary | ICD-10-CM

## 2024-03-08 NOTE — Progress Notes (Signed)
 Colon and Rectal Surgery Clinic - Consult H&P   Attending: Dr. Alyce Greet (PI# [id number], Pager: [phone])   Date of Appointment: 03/08/2024 1:59 PM        CHIEF COMPLAINT / REASON FOR CONSULT     Pelvic Floor Dyssynergia and fecal incontinence    SUBJECTIVE     Jeanette Combs is a 88yr female presenting to the Colon and Rectal Surgery clinic today for Pelvic Floor Dyssynergia and fecal incontinence. She was referred to Dr. Greet by Dr. Darleene.    Patient has been having issues with fecal incontinence for the past 3 years. Per patient, in 1997 had a vaginal birth with 4th degree tears. Patient initially referred to gastroenterologist and has had extensive work up. Patient was then referred to colorectal surgeon in [facility]. Patient then had an anorectal manometry back in April 1st of 2025.    Patient has zero control of bowels at times with little to no warning. Patient has trialed physical therapy. She has participated in physical therapy for months once a week since last year and she has had more sessions this year as well. Last session was in May of this year. She notes minimal improvement.    Patient has had to change her lifestyle due to the incontinence. She has felt isolated and has had a lot of social anxiety. Patient most recent incontinence was this past Saturday. Patient describes multiple episodes in a week or it can be once a week. Stool can be formed or loose stools. Occurs with flatus as well. She does not wear a pad. Patient denies any history of IBD or issues with abdominal pain. Denies any diarrhea or blood in the stool currently. Denies any nausea or vomiting. Patient does hydrate, takes probiotics and other supplements.    03/03/24: First stage SNS insertion    03/08/24: Telephone follow up. She is doing well. Her symptoms are significantly improved. Much better than 50% improvement in her symptoms. No longer has the anxiety about going out an having accidents. She would like to move forward with  implantation given the excellent results.     COLORECTAL SURGICAL HISTORY     None      PAST MEDICAL HISTORY     Active Ambulatory Problems     Diagnosis Date Noted    Incontinence of feces, unspecified fecal incontinence type 03/03/2024     Resolved Ambulatory Problems     Diagnosis Date Noted    No Resolved Ambulatory Problems     Past Medical History:   Diagnosis Date    HTN (hypertension)     Metabolic syndrome     Nephrolithiasis     Renal cell carcinoma (HCC)     Right frontal osteoma     Sleep apnea         PAST SURGICAL HISTORY     Past Surgical History[1]  C-section  Nephrectomy for right renal cell carcinoma  Appendectomy  Hysterectomy with a bladder lift (November of 2023).      MEDICATIONS     Current Outpatient Medications on File Prior to Visit   Medication    Desvenlafaxine (PRISTIQ) 50 mg Sustained Release Tablet    Ergocalciferol, Vitamin D2, (VITAMIN D2) 50,000 unit Capsule    Lorazepam (ATIVAN) 1 mg Tablet    MAGNESIUM PO    Ondansetron  (ZOFRAN -ODT) 4 mg disintegrating tablet    Potassium Citrate (UROCIT-K) 10 mEq SR Tablet    rosuvastatin (Crestor) 5 mg tablet    tirzepatide (MOUNJARO SC)  Tramadol (ULTRAM) 50 mg Tablet    Valsartan (DIOVAN) 40 mg Tablet     No current facility-administered medications on file prior to visit.        ALLERGIES     Allergies[2]     SOCIAL HISTORY     Social History[3]  Patient denies any tobacco use   Patient denies any alcohol use  Patient denies any recreational drug use     FAMILY HISTORY     Family History[4]    No history of colorectal cancer.  No history of inflammatory bowel disease.         PHYSICAL EXAM     Recent Vital Signs:  Temp: --  Temp src: --  Pulse: --  BP: --  Resp: --  SpO2: --  Height: --  Weight: --    Telephone follow up       RECENT IMAGING          RECENT LABS     No results found for: WBC, HGB, PLT, CEA, NA, K, CR, ALB       ASSESSMENT AND PLAN     Jeanette Combs is a 54F presenting with fecal incontinence. Has hx of  episiotomy. Causing significant impact of day to day life. Happens multiple times per week. Has failed medical therapy and PT. She is interested in trial of sacral nerve stimulation, which we dicussed and for which I think she is an excellent candidate.     She is s/p 1st stage SNS insertion on 03/03/24. She is doing very well. Her symptoms are >50% improved and it has significantly improved her quality of life. She would like to move forward with stage 2 insertion    - Plan for stage 2 sacral nerve stimulator insertion      Telephone Visit Note     I performed this encounter via telephone and no video was used. The patient consented to receive this health care service by telephone.    Patient Location:  Other (Home)    I addressed the following concern(s) Sacral nerve stimulator trial.    Telephone call discussion time: It took me 5-10 minutes to complete this telephone call.    I spent a total of 15 minutes today on this patient's visit.    Alyce Greet, MD  Assistant Professor of Surgery  Colorectal Surgery                        [1]   Past Surgical History:  Procedure Laterality Date    CESAREAN DELIVERY ONLY      CHOLECYSTECTOMY, LAPAROSCOPIC      COLONOSCOPY  2024    normal    EGD  2023    normal    EXTRACORPOREAL SHOCK WAVE LITHOTRIPSY (ESWL)      HYSTERECTOMY      ovaries remain    LAP UMBILICAL HERNIA REPAIR      MANOMETRY, ANORECTAL  07/08/2023    NEPHRECTOMY      OTHER SURGICAL HISTORY      bladder lift    REPAIR, HIATAL HERNIA     [2]   Allergies  Allergen Reactions    Bactrim [Sulfamethoxazole-Trimethoprim] Hives    Levaquin [Levofloxacin] Unknown-Explain in Comments     Patient states happened a long time ago.     Miscellaneous Medical Supply Other-Reaction in Comments     Reaction to nasal cannula (started as runny nose that lead to nasal infection)- prefers to wear face  mask only.    [3]   Social History  Tobacco Use    Smoking status: Never    Smokeless tobacco: Never   Substance Use Topics    Alcohol  use: Not Currently    Drug use: Never   [4]   Family History  Problem Relation Name Age of Onset    Breast Cancer Maternal Grandmother

## 2024-03-11 NOTE — Patient Instructions (Signed)
 You were diagnosed today with a Urinary tract infection.    Will treat with Macrobid twice daily x 5 days. Urine sent to lab for culture and sensitivity, will notify you if a change needs to be made in your antibiotic therapy based on these results which will be available in approximately 3 days.     Increase water intake to include at least 10 glasses of water a day.   See your PCP or return to the clinic if no improvement in symptoms after completion of antibiotics.     Go to the Emergency department for worsening symptoms, fevers >100.4 despite treatment with tylenol , blood in urine, severe abdominal or flank pain, uncontrollable vomiting or diarrhea or concerning rashes.

## 2024-03-11 NOTE — Progress Notes (Signed)
 [facility]    Reason for Visit  1. Acute cystitis without hematuria      Assessment & Plan  Recurrent urinary tract infection  Persistent urinary urgency and odor despite recent Augmentin.  Recent urine culture from February 15, 2024 was pansensitive E. coli, however patient is concerned her symptoms have returned.    Current POC UA shows nitrites present. No systemic symptoms. Previous nephrectomy for renal carcinoma requires careful management.  - Ordered urine culture.  - Prescribed Macrobid 100 mg twice daily for 5 days, as patient states she has had success with this in the past, and her most recent culture shows this would also be adequate treatment.    History of kidney cancer, status post nephrectomy  Kidney cancer treated 11 years ago with nephrectomy. Only one kidney remains.  - Ensure careful management of urinary tract infections to protect remaining kidney.    We discussed her ordered medications, symptomatic treatment, cautious monitoring of her symptoms, reasons to return to this clinic, the importance of follow-up with their PCP, and reasons that would warrant immediate evaluation in the emergency department.  POC discussed, questions were answered, understanding verbalized.  Copy of AVS given upon discharge.     Subjective     History of Present Illness  Jeanette Combs is a 53 year old female with recurrent urinary tract infections and a history of renal cancer with nephrectomy approximately 11 years ago who presents with persistent UTI symptoms.    She has persistent symptoms of a urinary tract infection, including urgency and a certain odor, despite completing a course of Augmentin prescribed on November 9th for an upper respiratory infection and UTI. She completed the Augmentin regimen, taking it twice a day for ten days, but continues to experience symptoms despite her urine culture showing pan sensitive E. coli.    She states she has a history of recurrent UTIs and has  previously been treated with Macrobid, which has been effective in the past.    Her past medical history is significant for kidney cancer, diagnosed eleven years ago, resulting in the removal of one kidney. No history of kidney infections.  She denies any current fevers, chills, myalgias, abdominal pain, flank pain, hematuria, dysuria, or increased frequency of urination.     She states she has experienced difficulty in communicating with her healthcare providers through MyChart, which has delayed her follow-up care.     Objective   BP 105/71   Pulse 68   Temp 36.2 C (97.2 F) (Temporal)   Resp 16   Wt 85.4 kg (188 lb 4.4 oz)   SpO2 99%   BMI 28.63 kg/m   Physical Exam    Physical Exam:   Constitutional: No distress. She appears well-developed and well-nourished.   HENT:   Head: Normocephalic and atraumatic.   Eyes: Conjunctivae are normal. Right eye exhibits no discharge. Left eye exhibits no discharge. No scleral icterus.   Neck: ROM normal and full passive range of motion without pain. Supple.   Cardiovascular:  Normal rate and normal pulses.            Pulmonary/Chest: Effort normal. No respiratory distress.   Abdominal:      Tenderness: There is no abdominal tenderness. There is no right CVA tenderness or left CVA tenderness.   Musculoskeletal: Normal range of motion.   Lymphadenopathy:     She has no cervical adenopathy.   Neurological: She is alert and oriented to person, place, and  time.   Skin: Skin is warm and dry. Capillary refill takes less than 2 seconds. No rash noted. She is not diaphoretic.   Psychiatric: She has a normal mood and affect.   Vitals reviewed.      Results  LABS  Urine culture: Escherichia coli present (02/15/2024)  Urinalysis: Nitrites present (02/15/2024)      Results for orders placed or performed in visit on 03/11/24   POCT Urinalysis Dipstick    Collection Time: 03/11/24  5:45 PM   Result Value Ref Range    Color, Urine Yellow Light Yellow, Yellow, Dark Yellow    Appearance  Urine Clear Clear    Glucose, Urine Negative Negative    Bilirubin Urine Negative Negative    Ketones Urine Negative Negative    Specific Gravity Urine 1.020 1.005 - 1.030    Blood Urine Negative Negative    PH Urine 6.0 5.0 - 9.0         Protein Urine Negative Negative    Urobilinogen Urine 0.2 0.2 - 1.0 E.U./dL    Nitrite, Urine Positive (A) Negative    Leukocytes Urine Trace (A) Negative          Additional Notes      ROCHELLE LARUE or their legal representative provided verbal consent for the audio recording of today's visit. This note was generated using AI technology, potentially without use of an audio recording of the visit in accordance with the aforementioned consent. However, I have reviewed, edited, and finalized this document.

## 2024-03-15 NOTE — Pre-Op/Pre-Procedure Screening (Signed)
 [facility] Preoperative Phone Call Documentation 48[facility] -  DOS 03/17/2024    Procedure(s):  INSERTION, SACRAL NERVE STIMULATOR, STAGE 2 (N/A)  Surgeon(s):  Darrel Mallick, MD      Call to provide Pre-op instructions. Spoke with patient. ID x 3.  ACTIVE MyChart Account: A MyChart message was sent with detailed Pre-op instructions, requesting a return call for confirmation.         Location and Check-in for Day of Surgery/Procedure    [facility] location: [phone] Complex - Address: [address], [place], [place] [address].    Verification of planned surgery times  Surgery Date/Time Verified: Yes (03/17/24 at 0730AM)  Arrival Time Verified: 0530  Surgery Check-In Location Verified: [facility] OR    Patient parking is located in Parking Structure #4. The address is [address] Designated patient parking is located on Level 1 in the [employer]. On the day of surgery, we will validate one parking ticket per patient per day with no in-and-outs, free of charge.          Please enter through the Main Entrance of the building and continue to the Main Lobby to begin the check-in process.      Once you enter the Main Lobby, you will see the Guest Relations desk and Admissions off to your right-hand side.    Here are your instructions for the check-in process:    For patients who are instructed to arrive at 5:30 AM or later (or 6:30 AM or later on Tuesdays): Stop at Guest Relations then proceed to Admissions for check-in. The Admissions staff will then direct you up to the 2nd floor to [address]: Pre-Procedure/PACU. Here, you will check-in with our Patient Navigator Team and our Pre-op staff will be updated regarding your arrival.      Please note: As [facility] is a trauma center, the time of surgery is subject to change.  If a change is necessary- someone will contact you as soon as possible with new instructions for the day of surgery. Please make arrangements to be flexible for the day of surgery.  If  you are unable to accommodate the necessary change- please notify the surgical clinic or surgery scheduler.      Diet Instructions    Fasting Instructions:  Time of Last Clear Liquids: 0530  Date of Last Clear Liquids: 03/17/24  Time of Last Full Liquids: 2200 (10PM)  Date of Last Full Liquids: 03/16/24  Time of Last Solids : 0000 (midnight)  Date of Last Solids: 03/16/24    If you have received special diet instructions from your Surgeon (for example, bowel prep) Please Follow Those Instructions.  If you have questions regarding those instructions- please reach out to your Surgical Clinic     Fasting Guidelines for Adults Taking GLP-1 Agonists (Ozempic, Wegovy, Trulicity, etc.)    Some medications for weight loss or diabetes require that you to take a very specific diet before surgery. We have identified that you are taking one of these medications. It is important that you follow these instructions carefully to prevent medical complications.    Unless instructed otherwise by a member of your care team, you may eat a regular diet until instructed to STOP solids and start FULL liquids the day before your surgery. If you have special dietary needs that won't let you follow this diet, please reach out to the medical professional who manages that diet.    If you have received special bowel  prep diet instructions, please follow those instead.        Do not eat solid foods the day before your procedure. Do not chew gum, eat mints or candies, or use any oral nicotine products (chewing tobacco, nicotine packets) while you are unable to eat solids.    These are FULL liquids. FULL liquids and CLEAR liquids are the only things you may have the day before your surgery. NO SOLIDS.  Thin soup without chunks of meat or vegetable. Pureed soups (like tomato) or creamy soups are acceptable.  Milk or milk-based liquids (cream, milk shakes, ice cream without chunky ingredients)  Plant-based milk  Yogurt  Protein drinks  (Ensure)  Pudding  Jell-o (gelatin-based only, no chunks or fruit)  Carbonated Sodas  Liquids with added thickeners to aid with swallowing.      These are CLEAR liquids. CLEAR liquids are the only things you may have starting at 10pm or midnight (see times above) and ending 2 hours before surgery. NO SOLIDS OR FULL LIQUIDS.  Water and flavored water  Yellow/[place]/green sports drinks  Ensure Clear (only the version labeled Clear is allowed)  Filtered Apple or White Grape juice (NO PULP OR CHUNKS). Please do not consume red juices.  Black Coffee or Black Tea without any cream additives (no milk, plant milk, or creamer).  Thickened liquids (to aid with swallowing) are considered FULL liquids, not clear.  DO NOT DRINK alcohol containing beverages.    Starting 2 hours before your procedure, you should not take ANYTHING by mouth. No solids, No liquids.    Instructions for Day of Surgery    Do not smoke or drink alcohol.  Bring a building services engineer ID and medical insurance card. (Pediatric patients do not need to present an ID).  Bathe or shower on the morning of surgery. After bathing, be sure that personal care products are NOT applied to the skin; such as: cosmetics, deodorant, ointments, creams, lotions, powders or scented products.   Wear comfortable clothing that is easy to take on and off.   Leave all jewelry at home and be sure to remove all piercings, if applicable.  Pediatric patients: Bring any comfort/entertainment items as needed, for which you will be responsible.  Do not bring any valuables.   Do not bring any medications, unless instructed otherwise.   [facility] is cashless, therefore, in the event the provider prescribes discharge medications, be prepared by bringing a credit card for payment.        Bring your CPAP/BiPAP machine, if applicable.   If you are bringing any medical devices (CPAP, motorized wheelchair), to the hospital that require charging, please remember to bring the charger(s) with you.  Please ensure whoever is accompanying the patient can monitor and maintain the equipment.  If you are discharged home the same day as surgery and do not require a 23-hr stay for observation, you must arrange for a responsible adult to transport you home and stay with you for 24 hours. Surgery will be cancelled if you are unable to accommodate this.       General information     Guest Relations is open from 5:00 AM-6:00 PM, Monday-Friday.   If needed, wheelchairs are available for personal use in the Main Lobby to help transport patients up to the surgery floor.   The [facility] pharmacy is located on the 1st floor, [address], and is open Monday-Friday from 8:00 AM-7:00 PM.   The surgery waiting room is equipped with self-service lockers  for patient belongings. Family/friends are also encouraged to hold on to your belongings while you are in surgery.    Only those patients who are experiencing any cold, flu or Covid-like symptoms will need to complete a home Covid test and report the result back to us  at [phone].     Visitor information:   All Patients: You may have 2 consistent visitors over the age of 1 accompany you to the waiting room. All visitors of patients will need to present an ID in order to obtain a visitor's pass from Guest Relations (if they do not have an ID, we can create a visitor's pass without it as well). Visitors will not be allowed if they have any cold/flu/Covid symptoms. Visiting hours in the [facility] Complex: 9:00 AM-9:00 PM.   Pediatric Patients: There must be at least one legal guardian present who can consent for the patient.        Please call [phone] if you develop any new upper respiratory symptoms and/or rash, if there are changes to the medications you take, or if you have any questions about your arrival instructions. If you have questions pertaining specifically to your surgery/procedure or regarding post-operative plans or care, please contact your surgeon's clinic  directly.        Lamiracle Chaidez, RN  Pre-Anesthesia Clinic / Pre-Op Calls

## 2024-03-16 NOTE — Pre-Op/Pre-Procedure Screening (Signed)
 [facility] Preoperative Phone Call Documentation 48X -  DOS 03/17/2024    Procedure(s):  INSERTION, SACRAL NERVE STIMULATOR, STAGE 2 (N/A)  Surgeon(s):  Darrel Mallick, MD      Call to provide Pre-op instructions. Spoke with patient. ID x 3.  Successfully provided Pre-op instructions and sent via MyChart, if patient has active MyChart account.    Pt declined MyChart message with medication instructions. She requested medication instructions only via phone call and wrote the medication instructions down.    Pt confirmed she had no solid food today on 03/16/24.      Infectious Disease Screen:    1. In the past 2 weeks, has the patient had new cold/influenza-like symptoms not related to a chronic illness such as: Fever >100 F(37.8 C), chills, muscle pain, headache, abdominal pain, diarrhea, nausea, vomiting, loss of taste/smell, cough, shortness of breath, runny nose, nasal/chest congestion, rash, or sore throat?     NO    2. In the past 3 weeks, has the patient been exposed to anyone with measles or cold symptoms AND rash?     NO      ADULTS ONLY: Do you/does the patient have any special needs we should be aware of?  Adults: Yes (place in Special Needs)      Location and Check-in for Day of Surgery/Procedure    [facility] location: [phone] Complex - Address: [address], [place], [place] [address].    Verification of planned surgery times  Surgery Date/Time Verified: Yes (03/17/24 at 0730AM)  Arrival Time Verified: 0530  Surgery Check-In Location Verified: [facility] OR    Patient parking is located in Parking Structure #4. The address is [address] Designated patient parking is located on Level 1 in the [employer]. On the day of surgery, we will validate one parking ticket per patient per day with no in-and-outs, free of charge.      Post surgical discharge information   Patient's Ride Home: Will be waiting in waiting room  Person to pick up pt (if different than support person): Will    Please  enter through the Main Entrance of the building and continue to the Main Lobby to begin the check-in process.      Once you enter the Main Lobby, you will see the Guest Relations desk and Admissions off to your right-hand side.    Here are your instructions for the check-in process:    For patients who are instructed to arrive at 5:30 AM or later (or 6:30 AM or later on Tuesdays): Stop at Guest Relations then proceed to Admissions for check-in. The Admissions staff will then direct you up to the 2nd floor to [address]: Pre-Procedure/PACU. Here, you will check-in with our Patient Navigator Team and our Pre-op staff will be updated regarding your arrival.      Please note: As [facility] is a trauma center, the time of surgery is subject to change.  If a change is necessary- someone will contact you as soon as possible with new instructions for the day of surgery. Please make arrangements to be flexible for the day of surgery.  If you are unable to accommodate the necessary change- please notify the surgical clinic or surgery scheduler.      Diet Instructions    Fasting Instructions:  Time of Last Clear Liquids: 0530  Date of Last Clear Liquids: 03/17/24  Time of Last Full Liquids: 2200 (10PM)  Date of Last Full Liquids: 03/16/24  Time of Last Solids : 0000 (midnight)  Date of Last Solids: 03/16/24  If you have received special diet instructions from your Surgeon (for example, bowel prep) Please Follow Those Instructions.  If you have questions regarding those instructions- please reach out to your Surgical Clinic       Fasting Guidelines for Adults Taking GLP-1 Agonists (Ozempic, Wegovy, Trulicity, etc.)    Some medications for weight loss or diabetes require that you to take a very specific diet before surgery. We have identified that you are taking one of these medications. It is important that you follow these instructions carefully to prevent medical complications.    Unless instructed otherwise by a member  of your care team, you may eat a regular diet until instructed to STOP solids and start FULL liquids the day before your surgery. If you have special dietary needs that won't let you follow this diet, please reach out to the medical professional who manages that diet.    If you have received special bowel prep diet instructions, please follow those instead.        Do not eat solid foods the day before your procedure. Do not chew gum, eat mints or candies, or use any oral nicotine products (chewing tobacco, nicotine packets) while you are unable to eat solids.    These are FULL liquids. FULL liquids and CLEAR liquids are the only things you may have the day before your surgery. NO SOLIDS.  Thin soup without chunks of meat or vegetable. Pureed soups (like tomato) or creamy soups are acceptable.  Milk or milk-based liquids (cream, milk shakes, ice cream without chunky ingredients)  Plant-based milk  Yogurt  Protein drinks (Ensure)  Pudding  Jell-o (gelatin-based only, no chunks or fruit)  Carbonated Sodas  Liquids with added thickeners to aid with swallowing.      These are CLEAR liquids. CLEAR liquids are the only things you may have starting at 10pm or midnight (see times above) and ending 2 hours before surgery. NO SOLIDS OR FULL LIQUIDS.  Water and flavored water  Yellow/[place]/green sports drinks  Ensure Clear (only the version labeled Clear is allowed)  Filtered Apple or White Grape juice (NO PULP OR CHUNKS). Please do not consume red juices.  Black Coffee or Black Tea without any cream additives (no milk, plant milk, or creamer).  Thickened liquids (to aid with swallowing) are considered FULL liquids, not clear.  DO NOT DRINK alcohol containing beverages.    Starting 2 hours before your procedure, you should not take ANYTHING by mouth. No solids, No liquids.        Allergies[1]    Tobacco Use History[2]  Social History     Substance and Sexual Activity   Alcohol Use Not Currently     Social History     Substance  and Sexual Activity   Drug Use Never       *If Patient drinks alcohol, they were advised not to drink alcohol on DOS.  *If Patient is a current smoker (Tobacco), they were advised not to smoke on DOS.  *If Patient uses Recreational Marijuana, they were advised not to use marijuana products for at least 3 days prior to surgery.   *If Patient uses Medical Marijuana (e.g. epilepsy, nausea), they were advised not to smoke on the day of surgery.          Patient Medication Instructions:    Stop NSAIDs 7 days prior to surgery (ibuprofen, motrin, naproxen, aleve, mobic, meloxicam, diclofenac,  etc) or as directed by your surgeon.    Acetaminophen  or Tylenol  is OK to keep taking.    Stop Vitamins and Supplements for 7 days prior to surgery (if you are taking prescribed vitamins and supplements consult the Prescribing Physician).     Aspirin and Blood thinner medication as directed by your Surgeon and Prescribing Physician (if you are unsure, call your surgeon).    Please bring your rescue inhaler on the day of surgery if you have one.       Current Medications   Medication Sig Instructions    Desvenlafaxine (PRISTIQ) 50 mg Sustained Release Tablet Take 1 tablet by mouth every day at bedtime.     Continue as prescribed    Ergocalciferol, Vitamin D2, (VITAMIN D2) 50,000 unit Capsule Take 1 capsule by mouth one time each week. Mondays     N/A - takes on Mondays    Lorazepam (ATIVAN) 1 mg Tablet Take 1 tablet by mouth every day at bedtime.     Continue as prescribed    MAGNESIUM PO Take by mouth every day at bedtime.     Continue as prescribed    metformin HCl (METFORMIN PO) Take by mouth every day at bedtime.   Pt does not currently know if extended release or standard release form or the dose. She said she will let the nursing staff know on DOS 03/17/24. She did confirm she takes it in the evening.  Pt states she is not home right now and does not have access to her metformin pill bottle with the Rx info. -- surgery is  tomorrow on 03/17/24.       To err on the side of caution, since pt does not know if metformin is XR form or not, pt was instructed to HOLD her evening dose of metformin the night prior to [facility] 03/17/24.  (Will hold dose tonight on 03/16/24)            rosuvastatin (Crestor) 5 mg tablet Take 1 tablet by mouth every day at bedtime.     Continue as prescribed    tirzepatide (MOUNJARO SC) Inject 10 mg subcutaneously. (Patient taking differently: Inject 15 mg subcutaneously. Takes on Sundays)   Do not take on day of surgery    Tramadol (ULTRAM) 50 mg Tablet Take 1 tablet by mouth every 8 hours if needed.     Continue as prescribed    Valsartan (DIOVAN) 40 mg Tablet Take 1 tablet by mouth every day. Do not take on day of surgery          Instructions for Day of Surgery    Do not smoke or drink alcohol.  Bring a building services engineer ID and medical insurance card. (Pediatric patients do not need to present an ID).  Bathe or shower on the morning of surgery. After bathing, be sure that personal care products are NOT applied to the skin; such as: cosmetics, deodorant, ointments, creams, lotions, powders or scented products.   Wear comfortable clothing that is easy to take on and off.   Leave all jewelry at home and be sure to remove all piercings, if applicable.  Pediatric patients: Bring any comfort/entertainment items as needed, for which you will be responsible.  Do not bring any valuables.   Do not bring any medications, unless instructed otherwise.   [facility] pharmacy is cashless, therefore, in the event the provider prescribes discharge medications, be prepared by bringing a credit card for payment.  Bring your CPAP/BiPAP machine, if applicable.   If you are bringing any medical devices (CPAP, motorized wheelchair), to the hospital that require charging, please remember to bring the charger(s) with you. Please ensure whoever is accompanying the patient can monitor and maintain the equipment.  If you are discharged home the  same day as surgery and do not require a 23-hr stay for observation, you must arrange for a responsible adult to transport you home and stay with you for 24 hours. Surgery will be cancelled if you are unable to accommodate this.       General information     Guest Relations is open from 5:00 AM-6:00 PM, Monday-Friday.   If needed, wheelchairs are available for personal use in the Main Lobby to help transport patients up to the surgery floor.   The [facility] pharmacy is located on the 1st floor, [address], and is open Monday-Friday from 8:00 AM-7:00 PM.   The surgery waiting room is equipped with self-service lockers for patient belongings. Family/friends are also encouraged to hold on to your belongings while you are in surgery.    Only those patients who are experiencing any cold, flu or Covid-like symptoms will need to complete a home Covid test and report the result back to us  at [phone].     Visitor information:   All Patients: You may have 2 consistent visitors over the age of 71 accompany you to the waiting room. All visitors of patients will need to present an ID in order to obtain a visitor's pass from Guest Relations (if they do not have an ID, we can create a visitor's pass without it as well). Visitors will not be allowed if they have any cold/flu/Covid symptoms. Visiting hours in the [facility] Complex: 9:00 AM-9:00 PM.   Pediatric Patients: There must be at least one legal guardian present who can consent for the patient.        Please call [phone] if you develop any new upper respiratory symptoms and/or rash, if there are changes to the medications you take, or if you have any questions about your arrival instructions. If you have questions pertaining specifically to your surgery/procedure or regarding post-operative plans or care, please contact your surgeon's clinic directly.        Lauraine Dance, RN  Pre-Anesthesia Clinic / Pre-Op Calls         [1]   Allergies  Allergen Reactions    Bactrim  [Sulfamethoxazole-Trimethoprim] Hives    Levaquin [Levofloxacin] Unknown-Explain in Comments     Patient states happened a long time ago.     Miscellaneous Medical Supply Other-Reaction in Comments     Reaction to nasal cannula (started as runny nose that lead to nasal infection)- prefers to wear face mask only.    [2]   Social History  Tobacco Use   Smoking Status Never   Smokeless Tobacco Never

## 2024-03-16 NOTE — Pre-Op/Pre-Procedure Screening (Signed)
 [facility] Preoperative Phone Call Documentation 48X -  DOS 03/17/2024    Procedure(s):  INSERTION, SACRAL NERVE STIMULATOR, STAGE 2 (N/A)  Surgeon(s):  Darrel Mallick, MD      2nd attempt at providing Pre-op instructions. Generic voicemail left with request for return call to provide appointment information. Called emergency contact on her list- Friend- will relay a message for pt to call us  back-        Location and Check-in for Day of Surgery/Procedure    [facility] location: [facility] Complex - Address: [address] [place], [place] [address].    Verification of planned surgery times  Surgery Date/Time Verified: Yes (03/17/24 at 0730AM)  Arrival Time Verified: 0530  Surgery Check-In Location Verified: 48X OR    Patient parking is located in Parking Structure #4. The address is [address]. Designated patient parking is located on Level 1 in the [employer]. On the day of surgery, we will validate one parking ticket per patient per day with no in-and-outs, free of charge.          Please enter through the Main Entrance of the building and continue to the Main Lobby to begin the check-in process.      Once you enter the Main Lobby, you will see the Guest Relations desk and Admissions off to your right-hand side.    Here are your instructions for the check-in process:    For patients who are instructed to arrive at 5:30 AM or later (or 6:30 AM or later on Tuesdays): Stop at Guest Relations then proceed to Admissions for check-in. The Admissions staff will then direct you up to the 2nd floor to [address]: Pre-Procedure/PACU. Here, you will check-in with our Patient Navigator Team and our Pre-op staff will be updated regarding your arrival.      Please note: As [facility] is a trauma center, the time of surgery is subject to change.  If a change is necessary- someone will contact you as soon as possible with new instructions for the day of surgery. Please make arrangements to be flexible  for the day of surgery.  If you are unable to accommodate the necessary change- please notify the surgical clinic or surgery scheduler.      Diet Instructions    Fasting Instructions:  Time of Last Clear Liquids: 0530  Date of Last Clear Liquids: 03/17/24  Time of Last Full Liquids: 2200 (10PM)  Date of Last Full Liquids: 03/16/24  Time of Last Solids : 0000 (midnight)  Date of Last Solids: 03/16/24    If you have received special diet instructions from your Surgeon (for example, bowel prep) Please Follow Those Instructions.  If you have questions regarding those instructions- please reach out to your Surgical Clinic     Fasting Guidelines for Adults Taking GLP-1 Agonists (Ozempic, Wegovy, Trulicity, etc.)    Some medications for weight loss or diabetes require that you to take a very specific diet before surgery. We have identified that you are taking one of these medications. It is important that you follow these instructions carefully to prevent medical complications.    Unless instructed otherwise by a member of your care team, you may eat a regular diet until instructed to STOP solids and start FULL liquids the day before your surgery. If you have special dietary needs that won't let you follow this diet, please reach out to the medical professional who manages that diet.    If you  have received special bowel prep diet instructions, please follow those instead.        Do not eat solid foods the day before your procedure. Do not chew gum, eat mints or candies, or use any oral nicotine products (chewing tobacco, nicotine packets) while you are unable to eat solids.    These are FULL liquids. FULL liquids and CLEAR liquids are the only things you may have the day before your surgery. NO SOLIDS.  Thin soup without chunks of meat or vegetable. Pureed soups (like tomato) or creamy soups are acceptable.  Milk or milk-based liquids (cream, milk shakes, ice cream without chunky ingredients)  Plant-based  milk  Yogurt  Protein drinks (Ensure)  Pudding  Jell-o (gelatin-based only, no chunks or fruit)  Carbonated Sodas  Liquids with added thickeners to aid with swallowing.      These are CLEAR liquids. CLEAR liquids are the only things you may have starting at 10pm or midnight (see times above) and ending 2 hours before surgery. NO SOLIDS OR FULL LIQUIDS.  Water and flavored water  Yellow/[place]/green sports drinks  Ensure Clear (only the version labeled Clear is allowed)  Filtered Apple or White Grape juice (NO PULP OR CHUNKS). Please do not consume red juices.  Black Coffee or Black Tea without any cream additives (no milk, plant milk, or creamer).  Thickened liquids (to aid with swallowing) are considered FULL liquids, not clear.  DO NOT DRINK alcohol containing beverages.    Starting 2 hours before your procedure, you should not take ANYTHING by mouth. No solids, No liquids.      Instructions for Day of Surgery    Do not smoke or drink alcohol.  Bring a building services engineer ID and medical insurance card. (Pediatric patients do not need to present an ID).  Bathe or shower on the morning of surgery. After bathing, be sure that personal care products are NOT applied to the skin; such as: cosmetics, deodorant, ointments, creams, lotions, powders or scented products.   Wear comfortable clothing that is easy to take on and off.   Leave all jewelry at home and be sure to remove all piercings, if applicable.  Pediatric patients: Bring any comfort/entertainment items as needed, for which you will be responsible.  Do not bring any valuables.   Do not bring any medications, unless instructed otherwise.   [facility] pharmacy is cashless, therefore, in the event the provider prescribes discharge medications, be prepared by bringing a credit card for payment.        Bring your CPAP/BiPAP machine, if applicable.   If you are bringing any medical devices (CPAP, motorized wheelchair), to the hospital that require charging, please remember to  bring the charger(s) with you. Please ensure whoever is accompanying the patient can monitor and maintain the equipment.  If you are discharged home the same day as surgery and do not require a 23-hr stay for observation, you must arrange for a responsible adult to transport you home and stay with you for 24 hours. Surgery will be cancelled if you are unable to accommodate this.       General information     Guest Relations is open from 5:00 AM-6:00 PM, Monday-Friday.   If needed, wheelchairs are available for personal use in the Main Lobby to help transport patients up to the surgery floor.   The [facility] pharmacy is located on the 1st floor, [address], and is open Monday-Friday from 8:00 AM-7:00 PM.   The surgery waiting  room is equipped with self-service lockers for patient belongings. Family/friends are also encouraged to hold on to your belongings while you are in surgery.    Only those patients who are experiencing any cold, flu or Covid-like symptoms will need to complete a home Covid test and report the result back to us  at [phone].     Visitor information:   All Patients: You may have 2 consistent visitors over the age of 64 accompany you to the waiting room. All visitors of patients will need to present an ID in order to obtain a visitor's pass from Guest Relations (if they do not have an ID, we can create a visitor's pass without it as well). Visitors will not be allowed if they have any cold/flu/Covid symptoms. Visiting hours in the 48X Complex: 9:00 AM-9:00 PM.   Pediatric Patients: There must be at least one legal guardian present who can consent for the patient.        Please call [phone] if you develop any new upper respiratory symptoms and/or rash, if there are changes to the medications you take, or if you have any questions about your arrival instructions. If you have questions pertaining specifically to your surgery/procedure or regarding post-operative plans or care, please contact your  surgeon's clinic directly.        Elijah Boss, RN  Pre-Anesthesia Clinic / Pre-Op Calls

## 2024-03-16 NOTE — Pre-Op/Pre-Procedure Screening (Signed)
 PAC Preoperative Phone Call Documentation 48X -  DOS 03/17/2024    Procedure(s):  INSERTION, SACRAL NERVE STIMULATOR, STAGE 2 (N/A)  Surgeon(s):  Darrel Mallick, MD          [facility] call for DOS 03/17/24 attempted- patient will call back on her lunch break around 11 am. Instructed to read My chart message- Last med rec 11/24 (please verify any changes)          Location and Check-in for Day of Surgery/Procedure    [facility] location: [facility] Complex - Address: [address], [place], [place] [address].    Verification of planned surgery times  Surgery Date/Time Verified: Yes (03/17/24 at 0730AM)  Arrival Time Verified: 0530 am  Surgery Check-In Location Verified: 48X OR    Patient parking is located in Parking Structure #4. The address is [address]. Designated patient parking is located on Level 1 in the [employer]. On the day of surgery, we will validate one parking ticket per patient per day with no in-and-outs, free of charge.          Please enter through the Main Entrance of the building and continue to the Main Lobby to begin the check-in process.      Once you enter the Main Lobby, you will see the Guest Relations desk and Admissions off to your right-hand side.    Here are your instructions for the check-in process:    For patients who are instructed to arrive at 5:30 AM or later (or 6:30 AM or later on Tuesdays): Stop at Guest Relations then proceed to Admissions for check-in. The Admissions staff will then direct you up to the 2nd floor to [address]: Pre-Procedure/PACU. Here, you will check-in with our Patient Navigator Team and our Pre-op staff will be updated regarding your arrival.      Please note: As [facility] is a trauma center, the time of surgery is subject to change.  If a change is necessary- someone will contact you as soon as possible with new instructions for the day of surgery. Please make arrangements to be flexible for the day of surgery.  If you  are unable to accommodate the necessary change- please notify the surgical clinic or surgery scheduler.      Diet Instructions    Fasting Instructions:  Time of Last Clear Liquids: 0530  Date of Last Clear Liquids: 03/17/24  Time of Last Full Liquids: 2200 (10PM)  Date of Last Full Liquids: 03/16/24  Time of Last Solids : 0000 (midnight)  Date of Last Solids: 03/16/24    If you have received special diet instructions from your Surgeon (for example, bowel prep) Please Follow Those Instructions.  If you have questions regarding those instructions- please reach out to your Surgical Clinic      Anesthesia Fasting Guidelines for Adults:      No Solid Food, Broth or Dairy products after the Cutoff Time for Solids (See above documentation).  If you require thickened liquids to aid with swallowing, stop these at the same time as solid food.  Tube feeds (gastric or jejunal): Stop feeds at the cut off time for solid food.  On the morning of surgery:  Clear liquids may be continued UNTIL Cutoff time for Clear Liquids (See Above documentation)    Examples of clear liquids:  Water and flavored water  Yellow/[place]/green sports drinks  Ensure Clear (only the version labeled Clear is allowed)  Filtered Apple or White Grape  juice (NO PULP OR CHUNKS). Please do not consume red juices.  Black Coffee or Black Tea without any additives (no milk, plant milk, sugar, sugar substitutes, or creamer).  Thickened liquids (to aid with swallowing) Should be stopped at the cutoff time for solid foods  DO NOT EAT OR DRINK ANYTHING AFTER THE CUT OFF TIME FOR CLEAR LIQUIDS        Instructions for Day of Surgery    Do not smoke or drink alcohol.  Bring a building services engineer ID and medical insurance card. (Pediatric patients do not need to present an ID).  Bathe or shower on the morning of surgery. After bathing, be sure that personal care products are NOT applied to the skin; such as: cosmetics, deodorant, ointments, creams, lotions, powders or scented products.    Wear comfortable clothing that is easy to take on and off.   Leave all jewelry at home and be sure to remove all piercings, if applicable.  Pediatric patients: Bring any comfort/entertainment items as needed, for which you will be responsible.  Do not bring any valuables.   Do not bring any medications, unless instructed otherwise.   [facility] is cashless, therefore, in the event the provider prescribes discharge medications, be prepared by bringing a credit card for payment.        Bring your CPAP/BiPAP machine, if applicable.   If you are bringing any medical devices (CPAP, motorized wheelchair), to the hospital that require charging, please remember to bring the charger(s) with you. Please ensure whoever is accompanying the patient can monitor and maintain the equipment.  If you are discharged home the same day as surgery and do not require a 23-hr stay for observation, you must arrange for a responsible adult to transport you home and stay with you for 24 hours. Surgery will be cancelled if you are unable to accommodate this.       General information     Guest Relations is open from 5:00 AM-6:00 PM, Monday-Friday.   If needed, wheelchairs are available for personal use in the Main Lobby to help transport patients up to the surgery floor.   The [facility] pharmacy is located on the 1st floor, [address], and is open Monday-Friday from 8:00 AM-7:00 PM.   The surgery waiting room is equipped with self-service lockers for patient belongings. Family/friends are also encouraged to hold on to your belongings while you are in surgery.    Only those patients who are experiencing any cold, flu or Covid-like symptoms will need to complete a home Covid test and report the result back to us  at [phone].     Visitor information:   All Patients: You may have 2 consistent visitors over the age of 61 accompany you to the waiting room. All visitors of patients will need to present an ID in order to obtain a visitor's  pass from Guest Relations (if they do not have an ID, we can create a visitor's pass without it as well). Visitors will not be allowed if they have any cold/flu/Covid symptoms. Visiting hours in the [facility] Complex: 9:00 AM-9:00 PM.   Pediatric Patients: There must be at least one legal guardian present who can consent for the patient.        Please call [phone] if you develop any new upper respiratory symptoms and/or rash, if there are changes to the medications you take, or if you have any questions about your arrival instructions. If you have questions pertaining specifically to your  surgery/procedure or regarding post-operative plans or care, please contact your surgeon's clinic directly.        Elijah Boss, RN  Pre-Anesthesia Clinic / Pre-Op Calls   [facility]  Pre-Anesthesia Clinic [facility])  [phone]

## 2024-03-16 NOTE — Anesthesia Preprocedure Evaluation (Signed)
 Anesthesia Evaluation      Review of systems  YEP:Jeanette Combs is a 73yr female here today for Procedure(s):  INSERTION, SACRAL NERVE STIMULATOR, STAGE 2      Patient Active Problem List:     Incontinence of feces, unspecified fecal incontinence type            Anesthesia History    No history of anesthetic complications         Pulmonary      Sleep apnea  CPAP used   Cardiovascular      Hypertension  Controlled       Neuro/Psych    No seizures  No TIA  No CVA   History of Smoking    No history of smoking       GI/HEPATIC/RENAL    No GERD  Chronic renal disease (single kidney; removed for rcc)   ENDO/Other      No diabetes         Hematology     Oncology                 Physical Exam    Airway:    Mallampati score II  Thyromental distance >3 FB       Cardiovascular:    Cardiovascular exam within normal limits  Rhythm regular  Rate normal       Dental:    no notable dental history       Pulmonary:    Pulmonary exam within normal limits       Abdominal:                 Anesthesia Plan    ASA 3     general and MAC     Intravenous induction    Postoperative administration of opioids is intended.    Patient willing to receive blood products.  Anesthetic plan, perioperative analgesia plan, risks and benefits were discussed with patient.   Resident/Fellow/CRNA has discussed the plan with the attending.   I confirmed the pre-anesthetic exam and prescribed the anesthesia plan.  I reviewed the available note and agree with the documentation.

## 2024-03-17 ENCOUNTER — Ambulatory Visit: Admission: RE | Admit: 2024-03-17 | Discharge: 2024-03-17 | Source: Ambulatory Visit

## 2024-03-17 ENCOUNTER — Encounter: Payer: Self-pay | Admitting: Certified Registered"

## 2024-03-17 ENCOUNTER — Encounter: Admission: RE | Payer: Self-pay | Source: Ambulatory Visit

## 2024-03-17 DIAGNOSIS — R159 Full incontinence of feces: Secondary | ICD-10-CM

## 2024-03-17 SURGERY — INSERTION, SACRAL NERVE STIMULATOR, STAGE 2
Anesthesia: General | Site: Back | Wound class: Clean

## 2024-03-17 MED ORDER — LACTATED RINGERS IV INFUSION: INTRAVENOUS | Status: DC

## 2024-03-17 MED ORDER — ONDANSETRON HCL (PF) 4 MG/2 ML INJECTION SOLUTION
INTRAMUSCULAR | Status: DC | PRN
  Administered 2024-03-17: 4 mg via INTRAVENOUS

## 2024-03-17 MED ORDER — CEFAZOLIN 2 GRAM/100 ML IN DEXTROSE(ISO-OSMOTIC) INTRAVENOUS PIGGYBACK
INJECTION | INTRAVENOUS | Status: DC | PRN
  Administered 2024-03-17: 2 g via INTRAVENOUS

## 2024-03-17 MED ORDER — ONDANSETRON HCL (PF) 4 MG/2 ML INJECTION SOLUTION: 4.0000 mg | INTRAMUSCULAR | Status: DC | PRN

## 2024-03-17 MED ORDER — FENTANYL (PF) 50 MCG/ML INJECTION SOLUTION: 25.0000 ug | INTRAMUSCULAR | Status: DC | PRN

## 2024-03-17 MED ORDER — FENTANYL (PF) 50 MCG/ML INJECTION SOLUTION
INTRAMUSCULAR | Status: AC
  Filled 2024-03-17: qty 5

## 2024-03-17 MED ORDER — BUPIVACAINE (PF) 0.25 % (2.5 MG/ML) INJECTION SOLUTION
INTRAMUSCULAR | Status: AC
  Filled 2024-03-17: qty 20

## 2024-03-17 MED ORDER — INTRAOP DEXMEDETOMIDINE 100 MCG/ML IV BOLUS DOSE
Status: DC | PRN
  Administered 2024-03-17 (×5): 10 ug via INTRAVENOUS

## 2024-03-17 MED ORDER — INTRAOP LIDOCAINE PF 1% INJ 30 ML VIAL ADDITIVE
Status: DC | PRN
  Administered 2024-03-17: 10 mL

## 2024-03-17 MED ORDER — LIDOCAINE HCL 20 MG/ML (2 %) INJECTION SOLUTION
INTRAMUSCULAR | Status: DC | PRN
  Administered 2024-03-17: 60 mg via INTRAVENOUS

## 2024-03-17 MED ORDER — LIDOCAINE 1 %-EPINEPHRINE 1:100,000 INJECTION SOLUTION
INTRAMUSCULAR | Status: AC
  Filled 2024-03-17: qty 20

## 2024-03-17 MED ORDER — INTRAOP PROPOFOL INJ 20 ML VIAL (BOLUS+INFUSION)
Status: DC | PRN
  Administered 2024-03-17: 125 ug/kg/min via INTRAVENOUS

## 2024-03-17 MED ORDER — PROPOFOL 10 MG/ML INTRAVENOUS EMULSION
INTRAVENOUS | Status: AC
  Filled 2024-03-17: qty 100

## 2024-03-17 MED ORDER — HYDROMORPHONE 1 MG/ML INJECTION SYRINGE: 0.2000 mg | INJECTION | INTRAMUSCULAR | Status: DC | PRN

## 2024-03-17 MED ORDER — LACTATED RINGERS IV BOLUS - DURATION REQ
1000.0000 mL | Freq: Once | INTRAVENOUS | Status: AC
  Administered 2024-03-17: 1000 mL via INTRAVENOUS

## 2024-03-17 MED ORDER — LIDOCAINE HCL 10 MG/ML (1 %) INJECTION SOLUTION: 0.1000 mL | INTRAMUSCULAR | Status: DC | PRN

## 2024-03-17 SURGICAL SUPPLY — 31 items
BLADE KNIFE 11 (Blade) ×1 IMPLANT
CAUTERY BOVIE PAD ADULT (Cautery) ×1 IMPLANT
CHARGING SYSTEM 1401 (Device) IMPLANT
COVER LIGHT HANDLE STERIS LB83 (Other) ×3 IMPLANT
COVER PROBE GENERAL PURPOSE 6X (Cover) IMPLANT
DRAPE LAP CHOLESTECTOMY (Drape) ×1 IMPLANT
DRAPE SHEET ORTHO BAR 72 X 112IN (Drape) ×1 IMPLANT
DRAPE STERI IOBAN 3M 6650 (Drape) ×1 IMPLANT
DRAPE XRAY C-ARM COVER 4951 (Drape) ×1 IMPLANT
DRESSING DERMABOND ADVANCED TISSUE ADHESIVE (Dressing) ×1 IMPLANT
DRESSING TEGADERM 4 X 4.5IN MEDIUM (Dressing) ×2 IMPLANT
DRESSING TEGADERM 4IN X 4.75FT LATEX FREE (Dressing) ×2 IMPLANT
DRESSING TEGADERM 8 X 12IN LARGE (Dressing) ×1 IMPLANT
ELECTRODE BLADE INSULATED 2.75IN (Cautery) ×1 IMPLANT
GLOVES BIOGEL 7 1/2 TOP GLOVE LATEX (Glove) ×1 IMPLANT
GLOVES BIOGEL INDICATOR 8 GREEN UNDERGLOVE LATEX (Glove) ×1 IMPLANT
GOWN LARGE AERO BLUE AAMI 3 STANDARD (Gown) ×1 IMPLANT
HEADREST HEAD POSITIONER PRONE PILLOW SQUARE (Pad) IMPLANT
HEADREST PRONE PILLOW (Pad) ×1 IMPLANT
NEEDLE 22 GAUGE 1-1/2 IN REGULAR BEVEL (Needle) ×1 IMPLANT
NEEDLE 25 GAUGE X 1 1/2IN SAFETY HYPODERMIC (Needle) ×2 IMPLANT
NEUROSTIMULATOR 5101 (Spinal Stimulator) IMPLANT
PACK BASIN LATEX SAFE DYNJ0191310V (Pack) ×1 IMPLANT
PILLOW DISPOSABLE 21 X 27 IN (Pillow) ×2 IMPLANT
POSITIONER ARM CRADLE (Disp Inst) ×1 IMPLANT
SPONGE GAUZE 4 X 4IN 12PLY STERILE 10 PACK (Dressing) ×1 IMPLANT
SPONGE LAPAROTOMY 4X4IN 16 PLY RADIOPAQUE SURGICOUNT GAUZE STERILE LATEX FREE DISPOSABLE (Sponge) ×1 IMPLANT
SPONGE LAPAROTOMY RADIOPAQUE SURGICOUNT SAFE-T-LAP STERILE LATEX DISPOSABLE 18X18IN (Sponge) ×1 IMPLANT
SUTURE MONOCRYL PLUS 4-0 PS-2 27IN MONOFILAMENT ANTIBACTERIAL UNDYED ABSORBABLE (Suture) ×1 IMPLANT
SUTURE VICRYL 3-0 SH VCP416 (Suture) ×1 IMPLANT
SYRINGE 10ML LUER LOCK (Syringe) ×1 IMPLANT

## 2024-03-17 NOTE — Nurse Discharge Note (Signed)
 Discharge to home via wheelchair to private auto at 1152.   Belongings with patient.  See EMR for assessment.  Powell Bruckner, RN

## 2024-03-17 NOTE — Nurse Assessment (Signed)
 PACU ADMIT NURSING NOTE    Note Started: 03/17/2024, 08:31     Received patient from OR at 0823 hours via gurney.  Monitor and Alarms on.  Patient sleepy but arousable. Powell Bruckner, RN

## 2024-03-17 NOTE — H&P (Signed)
 Colon and Rectal Surgery - Pre-Op H&P   Attending: Dr. Alyce Greet           Date of Surgery: 03/17/2024  Resident: Blair [phone])    Pre-Operative Interval History and Physical Exam     Jeanette Combs was seen and evaluated pre-operatively. Please see the last charted History & Physical dated 12/15/2023 and 12/19/2023 for further detailed information. In the interval since the prior H&P and initial procedure, there have been no significant changes or new findings.     Jeanette Combs is a 53yr-old female scheduled for sacral nerve stimulator, permanent implantation. She has a history of fecal incontinence which has symptomatically improved from initial implantation. she reports feeling well and denies any changes to her health she has restarted metformin for her prediabetes since the last visit to our clinic.    She denies fever, chills, nausea, vomiting, chest pain, shortness of breath, or other symptoms of recent illness. She denies any recent ED visits, medications changes, or hospitalizations.    Vital Signs     Temp: 36.6 C (97.9 F) (12/10 0554)  Temp src: Temporal (12/10 0554)  Pulse: 60 (12/10 0554)  BP: 113/74 (12/10 0554)  Resp: 16 (12/10 0554)  SpO2: 98 % (12/10 0554)  Height: 172.7 cm (5' 8) (12/10 9445)  Weight: 83.5 kg (184 lb 1.4 oz) (12/10 0554)    Physical Exam     General: No acute distress. Comfortable.  Neuro: Alert, oriented, no focal deficits.  Pulm: Breathing comfortably on room air.  Abd: Soft, non-distended, non-tender.   Ext: Moves all extremities.    Recent Labs and Studies     No results found for: WBC, HGB, PLT, CEA, NA, K, CR, ALB    Pre-Operative H&P Summary     Plan to continue with sacral nerve stimulator operation as scheduled. Patient very satisfied with results from the first operation, has no further questions and would like to proceed.     The patient was given the opportunity to ask questions.  The patient's recent study results were reviewed.  The patient's  pre-operative orders have been entered.  The patient was consented for surgery, consent was reviewed.  The patient was signed/marked and is ready for surgery.    Anticipated Post-Op Disposition: Home    Rock Blair, MD  PGY1 General Surgery  PI: [id number]

## 2024-03-17 NOTE — Discharge Instructions (Signed)
 [facility]  Alyce Greet, MD    DISCHARGE INSTRUCTIONS FOR Sacral nerve stimulator implant    You may resume your regular diet without restriction  You have surgical glue over your incision. Leave this on until it falls off on its own. You have tape and gauze over your upper incision. This can be removed in 2 days or earlier if it starts falling off  You may shower beginning tomorrow. It is okay to allow soapy water to run over your incision and then pat dry. Do not scrub incision. Do not soak in a bath/water/swim for 2 weeks  Continue light activity for the next two weeks. You may resume all of your normal activities in 2 weeks without restriction.  For pain it is best to take ibuprofen and tylenol  alternating so that you get something every few hours. It is good to take this scheduled for the first few days after surgery to stay ahead of the pain. Ibuprofen 600 to 800mg  can be taken for a few days, but take this with food. Tylenol  1000 mg every 8 hours or 500 mg every 6 hours is okay. Follow the instructions on your bottles. Do no take more than 3000 mg of tylenol  per day    Contact us :  If you have any questions or concerns after our procedure, please call [phone] Monday-Friday 8:00 am-5:00 PM. After hours or on weekends, please call the hospital operator at [phone] and ask for the colorectal surgeon on call    What to watch for:  Please call using the numbers above if you have any of the following:  fever >101.5   Severe pain   Redness or drainage at the incision sites  Chest pain  Dizziness  Weakness   Fast pulse rate

## 2024-03-17 NOTE — Nurse Assessment (Signed)
 PREOP ADMIT NURSING NOTE    Note Started: 03/17/2024, 05:53     Received patient from as a direct admit at 0546 hours via ambulation.  Patient status  awake and alert.  Oriented to room and unit.  Admission assessment and care plan initiated.   PIN number cards provided to caregiver.        Elaine Middleton, RN

## 2024-03-17 NOTE — Op Note (Signed)
 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 Rechargeable device   Complications:  None apparent  EBL:  5 mL  Fluids:  300 mL  UOP: No foley  Specimens Removed:  * No specimens in log *   Outcome:  PACU in stable condition  Venous thrombosis prophylaxis: Not indicated      Procedure Description     Indications:   This is a 65yr year old woman with a history of fecal incontinence. She underwent first stage sacral nerve stimulator lead insertion she and is doing well. We are proceeding with the second stage insertion today.     Procedure in detail:   I met the patient in the preoperative holding area. We discussed the operative plan for today, including the indications, risks, benefits, and alternatives. Patient was in agreement with proceeding to the OR as planned.     Patient was brought back to the operating room and placed prone and monitored anesthesia sedation was begun. She received preoperative Ancef . Her lower back and buttock was prepped and draped in usual sterile fashion. A timeout was performed to confirm the correct patient and procedure. I began by injection 10 mL of  1:1 mix of 1% lidocaine  with epinephrine  and 0.25% marcaine . I then reopened the prior right iliac fossa incision. The lead extender was uncoupled from the lead. The lead extender was cut and removed completely in two pieces. The lead was then  attached to the [employer] device and secured. The pocket had previously been made and was of adequate depth. The device was implanted without issue. I then closed the incision in multiple layers. 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 The patient was placed back supine and awoken from her sedation without issue.    All instruments, needles and sponge counts were correct at the end of the case.   I was personally present for the entire procedure and performed all critical aspects of the case myself.    The information contained on this form is true and accurate to the best of my knowledge.  Further, I understand that if I misrepresent, falsify or conceal information regarding my participation in the professional service described above, I may be subject to fine, imprisonment, or civil penalty under applicable federal laws.    Comorbidity/Risk Capture:     Wound Classification: Clean  Case Type:  Elective  Was infection present at time of surgery? Infection was NOT present at time of operation  Closing Tray Protocol (Policy [email or web address]) Followed? Not Applicable      Potential Modifier: None   Are there any other factors about  the case that are important for correct coding?  no    Comorbidity/Risk Capture:     Wound Classification: Clean  Case Type:  Elective  Was infection present at time of surgery? Infection was NOT present at time of operation  Closing Tray Protocol (Policy [email or web address]) Followed? Not Applicable      Alyce Greet, MD  Assistant Professor of Surgery  Colorectal Surgery

## 2024-03-17 NOTE — Anesthesia Postprocedure Evaluation (Signed)
 Patient: Jeanette Combs    INSERTION, SACRAL NERVE STIMULATOR, STAGE 2    Anesthesia Type: general, MAC    Stop Bang: 5      Vitals Value Taken Time   BP 110/73 03/17/24 11:35   Pulse 56 03/17/24 11:37   Resp 14 03/17/24 11:37   Temp 36.4 C (97.5 F) 03/17/24 11:00   SpO2 99 % 03/17/24 11:37   Oxygen Concentration (%)     Device (Oxygen Therapy) room air 03/17/24 10:30   Vitals shown include unfiled device data.    Anesthesia Post Evaluation    Procedure: Procedure(s):  INSERTION, SACRAL NERVE STIMULATOR, STAGE 2  Location: [facility] OR  Anesthesia: General, Monitored Anesthesia Care    Patient location during evaluation: PACU  Patient participation: complete - patient participated  Level of consciousness: awake and alert  Pain score: 0  Pain management: adequate  Multimodal analgesia pain management approach      Patient did not have a perioperative block    Airway patency: patent    Cardiovascular status: blood pressure returned to baseline and stable  Respiratory status: room air  Hydration status: euvolemic  Anesthetic complications: no  Nausea absent in Recovery                      Greig Door, MD

## 2024-04-05 ENCOUNTER — Telehealth: Payer: Self-pay

## 2024-04-05 ENCOUNTER — Ambulatory Visit

## 2024-04-05 NOTE — Telephone Encounter (Signed)
 Verify x 3    Patient called in regards to her post op appt with Dr. Darrel on 04/12/2024. 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 Pt states she does not have any concerning symptoms at this time and is requesting to reschedule on a day other than 04/12/2024 at the latest possible time of the day. Pt requesting a call back to discuss.    Patient:[phone]    Junius Potters  Patient Service Representative III  [facility]  Phone:[phone]

## 2024-04-05 NOTE — Telephone Encounter (Signed)
 Telephone call to patient--verified name, DOB, MRN and/or address at initiation of call.    Pt rescheduled app to 05/10/2024. She had no further questions.    Nuala Cobble, RN

## 2024-05-10 ENCOUNTER — Ambulatory Visit

## 2024-05-10 VITALS — BP 121/80 | HR 60 | Temp 98.0°F | Resp 16 | Wt 183.4 lb

## 2024-05-10 DIAGNOSIS — R159 Full incontinence of feces: Secondary | ICD-10-CM

## 2024-05-10 NOTE — Nursing Note (Signed)
 Temp: 36.7 C (98 F) (02/02 1405)  Temp src: Skin (02/02 1405)  Pulse: 60 (02/02 1405)  BP: 121/80 (02/02 1405)  Resp: 16 (02/02 1405)  SpO2: 96 % (02/02 1405)  Height: --  Weight: 83.2 kg (183 lb 6.8 oz) (02/02 1405)    Vital signs taken, chief complaint noted, allergies verified and screened for pain.      Rosina Quin, [place] [facility]  Outpatient Surgery   [phone]
